# Patient Record
Sex: Male | Born: 1960 | Race: Black or African American | Hispanic: No
Health system: Midwestern US, Community
[De-identification: ages and names within clinical notes are randomized; demographics above are authoritative.]

## PROBLEM LIST (undated history)

## (undated) DIAGNOSIS — E785 Hyperlipidemia, unspecified: Secondary | ICD-10-CM

## (undated) DIAGNOSIS — R972 Elevated prostate specific antigen [PSA]: Secondary | ICD-10-CM

## (undated) DIAGNOSIS — N411 Chronic prostatitis: Secondary | ICD-10-CM

## (undated) DIAGNOSIS — N400 Enlarged prostate without lower urinary tract symptoms: Secondary | ICD-10-CM

## (undated) HISTORY — DX: Chronic prostatitis: N41.1

## (undated) HISTORY — DX: Elevated prostate specific antigen (PSA): R97.20

## (undated) HISTORY — DX: Benign prostatic hyperplasia without lower urinary tract symptoms: N40.0

## (undated) HISTORY — DX: Hyperlipidemia, unspecified: E78.5

## (undated) HISTORY — PX: KNEE SURGERY: SHX244

---

## 2019-04-03 ENCOUNTER — Emergency Department: Payer: 344 | Primary: Student in an Organized Health Care Education/Training Program

## 2019-04-03 ENCOUNTER — Emergency Department: Admit: 2019-04-03 | Payer: 344 | Primary: Student in an Organized Health Care Education/Training Program

## 2019-04-03 ENCOUNTER — Inpatient Hospital Stay: Admit: 2019-04-03 | Discharge: 2019-04-04 | Disposition: A | Discharge status: Home / Self Care | Payer: 344

## 2019-04-03 DIAGNOSIS — U071 COVID-19: Secondary | ICD-10-CM

## 2019-04-03 HISTORY — DX: COVID-19: U07.1

## 2019-04-03 LAB — CBC WITH AUTOMATED DIFF
ABS. BASOPHILS: 0 10*3/uL (ref 0.0–0.1)
ABS. EOSINOPHILS: 0.1 10*3/uL (ref 0.0–0.4)
ABS. IMM. GRANS.: 0 10*3/uL (ref 0.00–0.04)
ABS. LYMPHOCYTES: 1.2 10*3/uL (ref 0.8–3.5)
ABS. MONOCYTES: 0.5 10*3/uL (ref 0.0–1.0)
ABS. NEUTROPHILS: 3.6 10*3/uL (ref 1.8–8.0)
BASOPHILS: 0 % (ref 0–1)
EOSINOPHILS: 2 % (ref 0–7)
HCT: 43.6 % (ref 36.6–50.3)
HGB: 14.2 g/dL (ref 12.1–17.0)
IMMATURE GRANULOCYTES: 0 % (ref 0.0–0.5)
LYMPHOCYTES: 22 % (ref 12–49)
MCH: 26.2 PG (ref 26.0–34.0)
MCHC: 32.6 g/dL (ref 30.0–36.5)
MCV: 80.4 FL (ref 80.0–99.0)
MONOCYTES: 9 % (ref 5–13)
MPV: 9.9 FL (ref 8.9–12.9)
NEUTROPHILS: 67 % (ref 32–75)
PLATELET: 173 10*3/uL (ref 150–400)
RBC: 5.42 M/uL (ref 4.10–5.70)
RDW: 14.5 % (ref 11.5–14.5)
WBC: 5.5 10*3/uL (ref 4.1–11.1)

## 2019-04-03 LAB — CBC WITH AUTO DIFFERENTIAL
Basophils %: 0 % (ref 0–1)
Basophils Absolute: 0 10*3/uL (ref 0.0–0.1)
Eosinophils %: 2 % (ref 0–7)
Eosinophils Absolute: 0.1 10*3/uL (ref 0.0–0.4)
Granulocyte Absolute Count: 0 10*3/uL (ref 0.00–0.04)
Hematocrit: 43.6 % (ref 36.6–50.3)
Hemoglobin: 14.2 g/dL (ref 12.1–17.0)
Immature Granulocytes: 0 % (ref 0.0–0.5)
Lymphocytes %: 22 % (ref 12–49)
Lymphocytes Absolute: 1.2 10*3/uL (ref 0.8–3.5)
MCH: 26.2 PG (ref 26.0–34.0)
MCHC: 32.6 g/dL (ref 30.0–36.5)
MCV: 80.4 FL (ref 80.0–99.0)
MPV: 9.9 FL (ref 8.9–12.9)
Monocytes %: 9 % (ref 5–13)
Monocytes Absolute: 0.5 10*3/uL (ref 0.0–1.0)
Neutrophils %: 67 % (ref 32–75)
Neutrophils Absolute: 3.6 10*3/uL (ref 1.8–8.0)
Platelets: 173 10*3/uL (ref 150–400)
RBC: 5.42 M/uL (ref 4.10–5.70)
RDW: 14.5 % (ref 11.5–14.5)
WBC: 5.5 10*3/uL (ref 4.1–11.1)

## 2019-04-03 MED ORDER — SODIUM CHLORIDE 0.9% BOLUS IV
0.9 % | Freq: Once | INTRAVENOUS | Status: AC
Start: 2019-04-03 — End: 2019-04-03
  Administered 2019-04-03: 23:00:00 via INTRAVENOUS

## 2019-04-03 MED ORDER — ACETAMINOPHEN 500 MG TAB
500 mg | Freq: Once | ORAL | Status: AC
Start: 2019-04-03 — End: 2019-04-03
  Administered 2019-04-03: 23:00:00 via ORAL

## 2019-04-03 MED FILL — SODIUM CHLORIDE 0.9 % IV: INTRAVENOUS | Qty: 500

## 2019-04-03 MED FILL — TYLENOL EXTRA STRENGTH 500 MG TABLET: 500 mg | ORAL | Qty: 2

## 2019-04-03 NOTE — ED Notes (Signed)
Copies of labs sent with pt, rxs to [facility], o2 sats 98 %

## 2019-04-03 NOTE — ED Notes (Signed)
Reports generally weak all over, complaining of body aches, having SOB and frequent cough. SOB much worse with ambulation. Pt febrile during triage. Reports severe HA. Reports unable to taste or smell.

## 2019-04-03 NOTE — Progress Notes (Signed)
Phone number not working.

## 2019-04-03 NOTE — ED Provider Notes (Signed)
ED Provider Notes by Lynn Ito, MD at 04/03/19 1740                Author: Lynn Ito, MD  Service: --  Author Type: Physician       Filed: 04/05/19 2321  Date of Service: 04/03/19 1740  Status: Addendum          Editor: Lynn Ito, MD (Physician)          Related Notes: Original Note by Geraldine Solar, PA (Physician Assistant) filed at 04/03/19  2134               EMERGENCY DEPARTMENT HISTORY AND PHYSICAL EXAM           Date: 04/03/2019   Patient Name: Christopher Galvan        History of Presenting Illness          Chief Complaint       Patient presents with        ?  Cough     ?  Headache        ?  Shortness of Breath           History Provided By: Patient      HPI: Christopher Galvan,  59 y.o. male with a past medical history significant  for BPH and hypertension who presents to the ED with cc of gradual onset, gradually worsening generalized weakness, generalized body aches, and subjective fever x6 days.  Patient also complaining of  shortness of breath worsening with exertion, dry coughing, diffuse headache, anosmia, inability to taste, fatigue, diaphoresis as well.  No known Covid exposure.  No other sick contact.  Has been taking TheraFlu, with no relief.  Currently visiting family  from out of town. Has been in contact with multiple family members who are also from out of town. No history of lung or cardiac disease. Denies any nausea, vomiting, chest pain, dizziness, lightheadedness, one-sided weakness, visual changes.      There are no other complaints, changes, or physical findings at this time.      PCP: Unknown, Provider        No current facility-administered medications on file prior to encounter.           No current outpatient medications on file prior to encounter.             Past History        Past Medical History:     Past Medical History:        Diagnosis  Date         ?  BPH (benign prostatic hyperplasia)           ?  Hypertension             Past Surgical History:   History reviewed. No  pertinent surgical history.      Family History:   History reviewed. No pertinent family history.      Social History:     Social History          Tobacco Use         ?  Smoking status:  Never Smoker     ?  Smokeless tobacco:  Never Used       Substance Use Topics         ?  Alcohol use:  Never              Frequency:  Never         ?  Drug use:  Not on file           Allergies:   No Known Allergies           Review of Systems        Review of Systems    Constitutional: Positive for chills, diaphoresis , fatigue and fever.    HENT: Negative.     Respiratory: Positive for cough and shortness of breath . Negative for chest tightness and wheezing.     Cardiovascular: Negative for chest pain and palpitations.    Gastrointestinal: Negative for abdominal pain, diarrhea, nausea and vomiting.    Genitourinary: Negative for frequency and urgency.    Musculoskeletal: Positive for myalgias (generalized) . Negative for back pain, neck pain and neck stiffness.    Skin: Negative for rash.    Neurological: Positive for headaches. Negative for dizziness, weakness and light-headedness.    Psychiatric/Behavioral: Negative.     All other systems reviewed and are negative.           Physical Exam        Physical Exam   Vitals signs and nursing note reviewed.   Constitutional:        General: He is not in acute distress.     Appearance: Normal appearance. He is well-developed. He is not ill-appearing, toxic-appearing or diaphoretic.      Comments: Overweight AA  male, very warm to touch    HENT:       Head: Normocephalic and atraumatic.      Nose: Nose normal. No congestion or rhinorrhea.      Mouth/Throat:      Mouth: Mucous membranes are moist.      Pharynx: Oropharynx is clear. No oropharyngeal exudate or posterior oropharyngeal  erythema.   Eyes :       General: No scleral icterus.     Conjunctiva/sclera: Conjunctivae normal.      Pupils: Pupils are equal, round, and reactive to light.   Neck:       Musculoskeletal: Normal range of  motion and neck supple. No neck rigidity or muscular tenderness.    Cardiovascular:       Rate and Rhythm: Normal rate and regular rhythm.      Pulses:            Radial pulses are 2+ on the right side and 2+  on the left side.         Dorsalis pedis pulses are 2+ on the right side and 2+  on the left side.      Heart sounds: No murmur. No friction rub. No gallop.     Pulmonary:       Effort: Pulmonary effort is normal. No tachypnea, accessory muscle usage, respiratory distress or retractions.      Breath sounds: Normal breath sounds . No stridor. No decreased breath sounds, wheezing, rhonchi or rales.      Comments: No conversational dyspnea, SpO2 100% on room air  Chest:       Chest wall: No tenderness.   Abdominal :      General: Bowel sounds are normal. There is no distension.      Palpations: Abdomen is soft. There is no mass.      Tenderness: There is no abdominal tenderness. There is no right CVA tenderness, left CVA tenderness, guarding or rebound.     Musculoskeletal: Normal range of motion.          General: No deformity.  Right lower leg: No edema.      Left lower leg: No edema.    Skin:      General: Skin is warm and dry.      Capillary Refill: Capillary refill takes less than 2 seconds.      Coloration: Skin is not jaundiced or pale.      Findings: No bruising, erythema or rash.   Neurological:       General: No focal deficit present.      Mental Status: He is alert and oriented to person, place, and time. Mental status is at baseline.      Sensory: Sensation is intact.      Motor: Motor function is intact.   Psychiatric:         Mood and Affect: Mood normal.          Behavior: Behavior normal. Behavior is cooperative.         Thought Content: Thought content normal.         Judgment: Judgment normal.               Lab and Diagnostic Study Results        Labs -         Recent Results (from the past 12 hour(s))     CBC WITH AUTOMATED DIFF          Collection Time: 04/03/19  6:10 PM         Result   Value  Ref Range            WBC  5.5  4.1 - 11.1 K/uL       RBC  5.42  4.10 - 5.70 M/uL       HGB  14.2  12.1 - 17.0 g/dL       HCT  43.6  36.6 - 50.3 %       MCV  80.4  80.0 - 99.0 FL       MCH  26.2  26.0 - 34.0 PG       MCHC  32.6  30.0 - 36.5 g/dL       RDW  14.5  11.5 - 14.5 %       PLATELET  173  150 - 400 K/uL       MPV  9.9  8.9 - 12.9 FL       NEUTROPHILS  67  32 - 75 %       LYMPHOCYTES  22  12 - 49 %       MONOCYTES  9  5 - 13 %       EOSINOPHILS  2  0 - 7 %       BASOPHILS  0  0 - 1 %       IMMATURE GRANULOCYTES  0  0.0 - 0.5 %       ABS. NEUTROPHILS  3.6  1.8 - 8.0 K/UL       ABS. LYMPHOCYTES  1.2  0.8 - 3.5 K/UL       ABS. MONOCYTES  0.5  0.0 - 1.0 K/UL       ABS. EOSINOPHILS  0.1  0.0 - 0.4 K/UL       ABS. BASOPHILS  0.0  0.0 - 0.1 K/UL       ABS. IMM. GRANS.  0.0  0.00 - 0.04 K/UL       DF  AUTOMATED          METABOLIC PANEL, COMPREHENSIVE  Collection Time: 04/03/19  6:10 PM         Result  Value  Ref Range            Sodium  140  136 - 145 mmol/L       Potassium  3.6  3.5 - 5.1 mmol/L       Chloride  105  97 - 108 mmol/L       CO2  27  21 - 32 mmol/L       Anion gap  8  5 - 15 mmol/L       Glucose  117 (H)  65 - 100 mg/dL       BUN  10  6 - 20 mg/dL       Creatinine  1.11  0.70 - 1.30 mg/dL       BUN/Creatinine ratio  9 (L)  12 - 20         GFR est AA  >60  >60 ml/min/1.72m            GFR est non-AA  >60  >60 ml/min/1.745m           Calcium  8.6  8.5 - 10.1 mg/dL       Bilirubin, total  0.5  0.2 - 1.0 mg/dL       AST (SGOT)  23  15 - 37 U/L       ALT (SGPT)  61  12 - 78 U/L       Alk. phosphatase  45  45 - 117 U/L       Protein, total  7.8  6.4 - 8.2 g/dL       Albumin  3.6  3.5 - 5.0 g/dL       Globulin  4.2 (H)  2.0 - 4.0 g/dL       A-G Ratio  0.9 (L)  1.1 - 2.2         LACTIC ACID          Collection Time: 04/03/19  6:10 PM         Result  Value  Ref Range            Lactic acid  1.3  0.4 - 2.0 mmol/L       TROPONIN I          Collection Time: 04/03/19  6:10 PM         Result  Value   Ref Range            Troponin-I, Qt.  <0.05  <0.05 ng/mL       URINALYSIS W/ RFLX MICROSCOPIC          Collection Time: 04/03/19  7:20 PM         Result  Value  Ref Range            Color  Yellow/Straw          Appearance  Clear  Clear         Specific gravity  1.025  1.003 - 1.030         pH (UA)  5.0  5.0 - 8.0         Protein  Negative  Negative mg/dL       Glucose  Negative  Negative mg/dL       Ketone  Negative  Negative mg/dL       Bilirubin  Negative  Negative         Blood  Negative  Negative         Urobilinogen  2.0 (H)  0.1 - 1.0 EU/dL       Nitrites  Negative  Negative         Leukocyte Esterase  Negative  Negative         WBC  0-4  0 - 4 /hpf       RBC  0-5  0 - 5 /hpf       Bacteria  Negative  Negative /hpf            Mucus  Trace  /lpf           Radiologic Studies -         CXR Results   (Last 48 hours)                                    04/03/19 1826    XR CHEST PA LAT  Final result            Narrative:    2 views.      No comparisons.      Minimal airspace density in the right lower lung.      No edema, dense infiltrate or pneumothorax.      Normal cardiomediastinal silhouette.                                Medical Decision Making     - I am the first provider for this patient.      - I reviewed the vital signs, available nursing notes, past medical history, past surgical history, family history and social history.      - Initial assessment performed. The patients presenting problems have been discussed, and they are in agreement with the care plan formulated and outlined with them.  I have encouraged them to ask questions as they arise throughout their visit.      Vital Signs-Reviewed the patient's vital signs.   Patient Vitals for the past 12 hrs:            Temp  Pulse  Resp  BP  SpO2            04/03/19 2005  99.2 ??F (37.3 ??C)  --  (!) 84  (!) 141/100  98 %            04/03/19 1717  (!) 102.8 ??F (39.3 ??C)  (!) 110  24  (!) 159/96  94 %           Records Reviewed: Nursing Notes and Old Medical  Records      The patient presents with fever, cough, sob with a differential diagnosis of pneumonia, bronchitis, viral syndrome, COVID-19         ED Course:         ED Course as of Apr 02 2130       Fri Apr 03, 2019        2003  Patient ambulated with nursing. SpO2 remained 98% on room air but patient became very tachypneic with RR in 30s and felt nauseated.     [NO]     2131  Per ED nurse, it was mistakenly documented that patient's RR is 84. Confirmed that it is patient's HR that is 84.     [NO]  ED Course User Index   [NO] Ong-Pham, Audie Clear, PA           Provider Notes (Medical Decision Making):       MDM   Number of Diagnoses or Management Options   Pneumonia of right lower lobe due to infectious organism   Suspected COVID-19 virus infection   Diagnosis management comments:       59 year old with multiple complaints and shortness of breath.  Suspect COVID-19 infection given his complaints.  Will send PCR test.  Work-up including basic labs, troponin, EKG unremarkable.  Chest x-ray revealing right lower lobe pneumonia.  Gave patient  dose of ceftriaxone and azithromycin in the emergency department as well as steroids and inhaler.  He was able to ambulate without hypoxia.  Will discharge home with z-pak, steroids, inhaler.  Recommend self quarantine.  Return precautions discussed          Amount and/or Complexity of Data Reviewed   Clinical lab tests: ordered and reviewed   Tests in the radiology section of CPT??: ordered and reviewed   Review and summarize past medical records: yes      Patient Progress   Patient progress: stable                     Disposition     Disposition: DC- Adult Discharges: All of the diagnostic tests were reviewed and questions answered. Diagnosis, care  plan and treatment options were discussed.  The patient understands the instructions and will follow up as directed. The patients results have been reviewed with them.  They have been counseled regarding their diagnosis.  The  patient verbally convey understanding  and agreement of the signs, symptoms, diagnosis, treatment and prognosis and additionally agrees to follow up as recommended with their PCP in 24 - 48 hours.  They also agree with the care-plan and convey that all of their questions have been answered.   I have also put together some discharge instructions for them that include: 1) educational information regarding their diagnosis, 2) how to care for their diagnosis at home, as well a 3) list of reasons why they would want to return to the ED prior to  their follow-up appointment, should their condition change.      Discharged      DISCHARGE PLAN:   1. There are no discharge medications for this patient.      2.      Follow-up Information               Follow up With  Specialties  Details  Why  [place]              [facility]  Emergency Medicine    As needed, If symptoms worsen  [place]   [phone]              Primary Care Provider    In 1 week  As needed               3.  Return to ED if worse    4.      Current Discharge Medication List              START taking these medications          Details        azithromycin (Zithromax Z-Pak) 250 mg tablet  Take 2 tablets on day 1, then take 1 tablet daily  x 4 days   Qty: 6 Tab, Refills:  0               predniSONE (DELTASONE) 20 mg tablet  Take 40 mg by mouth daily for 5 days. With Breakfast   Qty: 10 Tab, Refills:  0               albuterol (ProAir HFA) 90 mcg/actuation inhaler  Take 1 Puff by inhalation every four (4) hours as needed for Wheezing.   Qty: 1 Inhaler, Refills:  0               benzonatate (TESSALON) 100 mg capsule  Take 1 Cap by mouth three (3) times daily as needed for Cough for up to 7 days.   Qty: 21 Cap, Refills:  0               ondansetron (Zofran ODT) 4 mg disintegrating tablet  Take 1 Tab by mouth every eight (8) hours as needed for Nausea or Vomiting for up to 3 days.   Qty: 12 Tab, Refills:  0                               Diagnosis        Clinical Impression:       1.  Pneumonia of right lower lobe due to infectious organism         2.  Suspected COVID-19 virus infection            Attestations:      Geraldine Solar, PA      Please note that this dictation was completed with Dragon, the computer voice recognition software.  Quite often unanticipated grammatical, syntax, homophones, and other interpretive errors are inadvertently  transcribed by the computer software.  Please disregard these errors.  Please excuse any errors that have escaped final proofreading.  Thank you.

## 2019-04-03 NOTE — ED Triage Notes (Signed)
Reports generally weak all over, complaining of body aches, having SOB and frequent cough. SOB much worse with ambulation. Pt febrile during triage. Reports severe HA. Reports unable to taste or smell.

## 2019-04-03 NOTE — ED Notes (Signed)
Copies of labs sent with pt, rxs to [facility], o2 sats 98 %

## 2019-04-03 NOTE — ED Provider Notes (Addendum)
EMERGENCY DEPARTMENT HISTORY AND PHYSICAL EXAM      Date: 04/03/2019  Patient Name: Christopher Galvan    History of Presenting Illness     Chief Complaint   Patient presents with   ??? Cough   ??? Headache   ??? Shortness of Breath       History Provided By: Patient    HPI: Christopher Galvan, 60 y.o. male with a past medical history significant for BPH and hypertension who presents to the ED with cc of gradual onset, gradually worsening generalized weakness, generalized body aches, and subjective fever x6 days.  Patient also complaining of shortness of breath worsening with exertion, dry coughing, diffuse headache, anosmia, inability to taste, fatigue, diaphoresis as well.  No known Covid exposure.  No other sick contact.  Has been taking TheraFlu, with no relief.  Currently visiting family from out of town. Has been in contact with multiple family members who are also from out of town. No history of lung or cardiac disease. Denies any nausea, vomiting, chest pain, dizziness, lightheadedness, one-sided weakness, visual changes.    There are no other complaints, changes, or physical findings at this time.    PCP: Unknown, Provider    No current facility-administered medications on file prior to encounter.      No current outpatient medications on file prior to encounter.       Past History     Past Medical History:  Past Medical History:   Diagnosis Date   ??? BPH (benign prostatic hyperplasia)    ??? Hypertension        Past Surgical History:  History reviewed. No pertinent surgical history.    Family History:  History reviewed. No pertinent family history.    Social History:  Social History     Tobacco Use   ??? Smoking status: Never Smoker   ??? Smokeless tobacco: Never Used   Substance Use Topics   ??? Alcohol use: Never     Frequency: Never   ??? Drug use: Not on file       Allergies:  No Known Allergies      Review of Systems     Review of Systems   Constitutional: Positive for chills, diaphoresis, fatigue and fever.   HENT: Negative.     Respiratory: Positive for cough and shortness of breath. Negative for chest tightness and wheezing.    Cardiovascular: Negative for chest pain and palpitations.   Gastrointestinal: Negative for abdominal pain, diarrhea, nausea and vomiting.   Genitourinary: Negative for frequency and urgency.   Musculoskeletal: Positive for myalgias (generalized). Negative for back pain, neck pain and neck stiffness.   Skin: Negative for rash.   Neurological: Positive for headaches. Negative for dizziness, weakness and light-headedness.   Psychiatric/Behavioral: Negative.    All other systems reviewed and are negative.      Physical Exam     Physical Exam  Vitals signs and nursing note reviewed.   Constitutional:       General: He is not in acute distress.     Appearance: Normal appearance. He is well-developed. He is not ill-appearing, toxic-appearing or diaphoretic.      Comments: Overweight AA male, very warm to touch   HENT:      Head: Normocephalic and atraumatic.      Nose: Nose normal. No congestion or rhinorrhea.      Mouth/Throat:      Mouth: Mucous membranes are moist.      Pharynx: Oropharynx is clear. No oropharyngeal  exudate or posterior oropharyngeal erythema.   Eyes:      General: No scleral icterus.     Conjunctiva/sclera: Conjunctivae normal.      Pupils: Pupils are equal, round, and reactive to light.   Neck:      Musculoskeletal: Normal range of motion and neck supple. No neck rigidity or muscular tenderness.   Cardiovascular:      Rate and Rhythm: Normal rate and regular rhythm.      Pulses:           Radial pulses are 2+ on the right side and 2+ on the left side.        Dorsalis pedis pulses are 2+ on the right side and 2+ on the left side.      Heart sounds: No murmur. No friction rub. No gallop.    Pulmonary:      Effort: Pulmonary effort is normal. No tachypnea, accessory muscle usage, respiratory distress or retractions.       Breath sounds: Normal breath sounds. No stridor. No decreased breath sounds, wheezing, rhonchi or rales.      Comments: No conversational dyspnea, SpO2 100% on room air  Chest:      Chest wall: No tenderness.   Abdominal:      General: Bowel sounds are normal. There is no distension.      Palpations: Abdomen is soft. There is no mass.      Tenderness: There is no abdominal tenderness. There is no right CVA tenderness, left CVA tenderness, guarding or rebound.   Musculoskeletal: Normal range of motion.         General: No deformity.      Right lower leg: No edema.      Left lower leg: No edema.   Skin:     General: Skin is warm and dry.      Capillary Refill: Capillary refill takes less than 2 seconds.      Coloration: Skin is not jaundiced or pale.      Findings: No bruising, erythema or rash.   Neurological:      General: No focal deficit present.      Mental Status: He is alert and oriented to person, place, and time. Mental status is at baseline.      Sensory: Sensation is intact.      Motor: Motor function is intact.   Psychiatric:         Mood and Affect: Mood normal.         Behavior: Behavior normal. Behavior is cooperative.         Thought Content: Thought content normal.         Judgment: Judgment normal.         Lab and Diagnostic Study Results     Labs -     Recent Results (from the past 12 hour(s))   CBC WITH AUTOMATED DIFF    Collection Time: 04/03/19  6:10 PM   Result Value Ref Range    WBC 5.5 4.1 - 11.1 K/uL    RBC 5.42 4.10 - 5.70 M/uL    HGB 14.2 12.1 - 17.0 g/dL    HCT 43.6 36.6 - 50.3 %    MCV 80.4 80.0 - 99.0 FL    MCH 26.2 26.0 - 34.0 PG    MCHC 32.6 30.0 - 36.5 g/dL    RDW 14.5 11.5 - 14.5 %    PLATELET 173 150 - 400 K/uL    MPV 9.9 8.9 - 12.9  FL    NEUTROPHILS 67 32 - 75 %    LYMPHOCYTES 22 12 - 49 %    MONOCYTES 9 5 - 13 %    EOSINOPHILS 2 0 - 7 %    BASOPHILS 0 0 - 1 %    IMMATURE GRANULOCYTES 0 0.0 - 0.5 %    ABS. NEUTROPHILS 3.6 1.8 - 8.0 K/UL    ABS. LYMPHOCYTES 1.2 0.8 - 3.5 K/UL     ABS. MONOCYTES 0.5 0.0 - 1.0 K/UL    ABS. EOSINOPHILS 0.1 0.0 - 0.4 K/UL    ABS. BASOPHILS 0.0 0.0 - 0.1 K/UL    ABS. IMM. GRANS. 0.0 0.00 - 0.04 K/UL    DF AUTOMATED     METABOLIC PANEL, COMPREHENSIVE    Collection Time: 04/03/19  6:10 PM   Result Value Ref Range    Sodium 140 136 - 145 mmol/L    Potassium 3.6 3.5 - 5.1 mmol/L    Chloride 105 97 - 108 mmol/L    CO2 27 21 - 32 mmol/L    Anion gap 8 5 - 15 mmol/L    Glucose 117 (H) 65 - 100 mg/dL    BUN 10 6 - 20 mg/dL    Creatinine 1.11 0.70 - 1.30 mg/dL    BUN/Creatinine ratio 9 (L) 12 - 20      GFR est AA >60 >60 ml/min/1.51m    GFR est non-AA >60 >60 ml/min/1.778m   Calcium 8.6 8.5 - 10.1 mg/dL    Bilirubin, total 0.5 0.2 - 1.0 mg/dL    AST (SGOT) 23 15 - 37 U/L    ALT (SGPT) 61 12 - 78 U/L    Alk. phosphatase 45 45 - 117 U/L    Protein, total 7.8 6.4 - 8.2 g/dL    Albumin 3.6 3.5 - 5.0 g/dL    Globulin 4.2 (H) 2.0 - 4.0 g/dL    A-G Ratio 0.9 (L) 1.1 - 2.2     LACTIC ACID    Collection Time: 04/03/19  6:10 PM   Result Value Ref Range    Lactic acid 1.3 0.4 - 2.0 mmol/L   TROPONIN I    Collection Time: 04/03/19  6:10 PM   Result Value Ref Range    Troponin-I, Qt. <0.05 <0.05 ng/mL   URINALYSIS W/ RFLX MICROSCOPIC    Collection Time: 04/03/19  7:20 PM   Result Value Ref Range    Color Yellow/Straw      Appearance Clear Clear      Specific gravity 1.025 1.003 - 1.030      pH (UA) 5.0 5.0 - 8.0      Protein Negative Negative mg/dL    Glucose Negative Negative mg/dL    Ketone Negative Negative mg/dL    Bilirubin Negative Negative      Blood Negative Negative      Urobilinogen 2.0 (H) 0.1 - 1.0 EU/dL    Nitrites Negative Negative      Leukocyte Esterase Negative Negative      WBC 0-4 0 - 4 /hpf    RBC 0-5 0 - 5 /hpf    Bacteria Negative Negative /hpf    Mucus Trace /lpf       Radiologic Studies -     CXR Results  (Last 48 hours)               04/03/19 1826  XR CHEST PA LAT Final result    Narrative:  2 views.   No  comparisons.    Minimal airspace density in the right lower lung.   No edema, dense infiltrate or pneumothorax.   Normal cardiomediastinal silhouette.               Medical Decision Making   - I am the first provider for this patient.    - I reviewed the vital signs, available nursing notes, past medical history, past surgical history, family history and social history.    - Initial assessment performed. The patients presenting problems have been discussed, and they are in agreement with the care plan formulated and outlined with them.  I have encouraged them to ask questions as they arise throughout their visit.    Vital Signs-Reviewed the patient's vital signs.  Patient Vitals for the past 12 hrs:   Temp Pulse Resp BP SpO2   04/03/19 2005 99.2 ??F (37.3 ??C) ??? (!) 84 (!) 141/100 98 %   04/03/19 1717 (!) 102.8 ??F (39.3 ??C) (!) 110 24 (!) 159/96 94 %       Records Reviewed: Nursing Notes and Old Medical Records    The patient presents with fever, cough, sob with a differential diagnosis of pneumonia, bronchitis, viral syndrome, COVID-19      ED Course:     ED Course as of Apr 02 2130   Fri Apr 03, 2019   2003 Patient ambulated with nursing. SpO2 remained 98% on room air but patient became very tachypneic with RR in 30s and felt nauseated.    [NO]   2131 Per ED nurse, it was mistakenly documented that patient's RR is 84. Confirmed that it is patient's HR that is 84.    [NO]      ED Course User Index  [NO] Ong-Pham, Audie Clear, PA       Provider Notes (Medical Decision Making):     MDM  Number of Diagnoses or Management Options  Pneumonia of right lower lobe due to infectious organism  Suspected COVID-19 virus infection  Diagnosis management comments:      59 year old with multiple complaints and shortness of breath.  Suspect COVID-19 infection given his complaints.  Will send PCR test.  Work-up including basic labs, troponin, EKG unremarkable.  Chest x-ray revealing right lower lobe pneumonia.  Gave patient dose of ceftriaxone and azithromycin in the emergency department as well as steroids and inhaler.  He was able to ambulate without hypoxia.  Will discharge home with z-pak, steroids, inhaler.  Recommend self quarantine.  Return precautions discussed       Amount and/or Complexity of Data Reviewed  Clinical lab tests: ordered and reviewed  Tests in the radiology section of CPT??: ordered and reviewed  Review and summarize past medical records: yes    Patient Progress  Patient progress: stable             Disposition   Disposition: DC- Adult Discharges: All of the diagnostic tests were reviewed and questions answered. Diagnosis, care plan and treatment options were discussed.  The patient understands the instructions and will follow up as directed. The patients results have been reviewed with them.  They have been counseled regarding their diagnosis.  The patient verbally convey understanding and agreement of the signs, symptoms, diagnosis, treatment and prognosis and additionally agrees to follow up as recommended with their PCP in 24 - 48 hours.  They also agree with the care-plan and convey that all of their questions have been answered.  I have also put together some discharge instructions for them  that include: 1) educational information regarding their diagnosis, 2) how to care for their diagnosis at home, as well a 3) list of reasons why they would want to return to the ED prior to their follow-up appointment, should their condition change.    Discharged    DISCHARGE PLAN:  1. There are no discharge medications for this patient.    2.   Follow-up Information     Follow up With Specialties Details [facility]     [place] Emergency Medicine  As needed, If symptoms worsen [place]  [phone]    Primary Care Provider  In 1 week As needed         3.  Return to ED if worse   4.   Current Discharge Medication List      START taking these medications    Details   azithromycin (Zithromax Z-Pak) 250 mg tablet Take 2 tablets on day 1, then take 1 tablet daily x 4 days  Qty: 6 Tab, Refills: 0      predniSONE (DELTASONE) 20 mg tablet Take 40 mg by mouth daily for 5 days. With Breakfast  Qty: 10 Tab, Refills: 0      albuterol (ProAir HFA) 90 mcg/actuation inhaler Take 1 Puff by inhalation every four (4) hours as needed for Wheezing.  Qty: 1 Inhaler, Refills: 0      benzonatate (TESSALON) 100 mg capsule Take 1 Cap by mouth three (3) times daily as needed for Cough for up to 7 days.  Qty: 21 Cap, Refills: 0      ondansetron (Zofran ODT) 4 mg disintegrating tablet Take 1 Tab by mouth every eight (8) hours as needed for Nausea or Vomiting for up to 3 days.  Qty: 12 Tab, Refills: 0               Diagnosis     Clinical Impression:   1. Pneumonia of right lower lobe due to infectious organism    2. Suspected COVID-19 virus infection        Attestations:    Geraldine Solar, PA    Please note that this dictation was completed with Dragon, the computer voice recognition software.  Quite often unanticipated grammatical, syntax, homophones, and other interpretive errors are inadvertently transcribed by the computer software.  Please disregard these errors.  Please excuse any errors that have escaped final proofreading.  Thank you.

## 2019-04-03 NOTE — Other (Signed)
covid test done, pt dx pneumonia, o2 sats remain above 96 percent

## 2019-04-03 NOTE — Other (Cosign Needed)
Spoke with patient.  He called looking for his COVID-19 test results.  Patient tested positive on 04/03/2019.  According to notes, phone number was not working at that time.  Was able to discuss results with patient at this time.  He states he is feeling ill still, coughing a lot, having a hard time breathing.  I advised him that if he felt like he was having a hard time breathing and his symptoms were not improving, he should return to the ER to be evaluated.  He is taking his medications as prescribed at this time.  He states he will see how he feels in a little bit and potentially come back to be seen.

## 2019-04-04 LAB — URINALYSIS W/ RFLX MICROSCOPIC
BACTERIA, URINE: NEGATIVE /hpf
Bacteria: NEGATIVE /hpf
Bilirubin, Urine: NEGATIVE
Bilirubin: NEGATIVE
Blood, Urine: NEGATIVE
Blood: NEGATIVE
Glucose, Ur: NEGATIVE mg/dL
Glucose: NEGATIVE mg/dL
Ketone: NEGATIVE mg/dL
Ketones, Urine: NEGATIVE mg/dL
Leukocyte Esterase, Urine: NEGATIVE
Leukocyte Esterase: NEGATIVE
Nitrite, Urine: NEGATIVE
Nitrites: NEGATIVE
Protein, UA: NEGATIVE mg/dL
Protein: NEGATIVE mg/dL
Specific Gravity, UA: 1.025 (ref 1.003–1.030)
Specific gravity: 1.025 (ref 1.003–1.030)
Urobilinogen, UA, POCT: 2 EU/dL — ABNORMAL HIGH (ref 0.1–1.0)
Urobilinogen: 2 EU/dL — ABNORMAL HIGH (ref 0.1–1.0)
pH (UA): 5 (ref 5.0–8.0)
pH, UA: 5 (ref 5.0–8.0)

## 2019-04-04 LAB — METABOLIC PANEL, COMPREHENSIVE
A-G Ratio: 0.9 — ABNORMAL LOW (ref 1.1–2.2)
ALT (SGPT): 61 U/L (ref 12–78)
AST (SGOT): 23 U/L (ref 15–37)
Albumin: 3.6 g/dL (ref 3.5–5.0)
Alk. phosphatase: 45 U/L (ref 45–117)
Anion gap: 8 mmol/L (ref 5–15)
BUN/Creatinine ratio: 9 — ABNORMAL LOW (ref 12–20)
BUN: 10 mg/dL (ref 6–20)
Bilirubin, total: 0.5 mg/dL (ref 0.2–1.0)
CO2: 27 mmol/L (ref 21–32)
Calcium: 8.6 mg/dL (ref 8.5–10.1)
Chloride: 105 mmol/L (ref 97–108)
Creatinine: 1.11 mg/dL (ref 0.70–1.30)
GFR est AA: >60 mL/min/{1.73_m2} (ref >60)
GFR est non-AA: >60 mL/min/{1.73_m2} (ref >60)
Globulin: 4.2 g/dL — ABNORMAL HIGH (ref 2.0–4.0)
Glucose: 117 mg/dL — ABNORMAL HIGH (ref 65–100)
Potassium: 3.6 mmol/L (ref 3.5–5.1)
Protein, total: 7.8 g/dL (ref 6.4–8.2)
Sodium: 140 mmol/L (ref 136–145)

## 2019-04-04 LAB — LACTIC ACID
Lactic Acid: 1.3 mmol/L (ref 0.4–2.0)
Lactic acid: 1.3 mmol/L (ref 0.4–2.0)

## 2019-04-04 LAB — TROPONIN I: Troponin-I, Qt.: <0.05 ng/mL (ref <0.05)

## 2019-04-04 LAB — SARS-COV-2

## 2019-04-04 LAB — COMPREHENSIVE METABOLIC PANEL
ALT: 61 U/L (ref 12–78)
AST: 23 U/L (ref 15–37)
Albumin/Globulin Ratio: 0.9 — ABNORMAL LOW (ref 1.1–2.2)
Albumin: 3.6 g/dL (ref 3.5–5.0)
Alkaline Phosphatase: 45 U/L (ref 45–117)
Anion Gap: 8 mmol/L (ref 5–15)
BUN: 10 mg/dL (ref 6–20)
Bun/Cre Ratio: 9 — ABNORMAL LOW (ref 12–20)
CO2: 27 mmol/L (ref 21–32)
Calcium: 8.6 mg/dL (ref 8.5–10.1)
Chloride: 105 mmol/L (ref 97–108)
Creatinine: 1.11 mg/dL (ref 0.70–1.30)
EGFR IF NonAfrican American: >60 mL/min/{1.73_m2} (ref >60)
GFR African American: >60 mL/min/{1.73_m2} (ref >60)
Globulin: 4.2 g/dL — ABNORMAL HIGH (ref 2.0–4.0)
Glucose: 117 mg/dL — ABNORMAL HIGH (ref 65–100)
Potassium: 3.6 mmol/L (ref 3.5–5.1)
Sodium: 140 mmol/L (ref 136–145)
Total Bilirubin: 0.5 mg/dL (ref 0.2–1.0)
Total Protein: 7.8 g/dL (ref 6.4–8.2)

## 2019-04-04 LAB — COVID-19

## 2019-04-04 LAB — TROPONIN: Troponin I: <0.05 ng/mL (ref <0.05)

## 2019-04-04 MED ORDER — SODIUM CHLORIDE 0.9% BOLUS IV
0.9 % | Freq: Once | INTRAVENOUS | Status: AC
Start: 2019-04-04 — End: 2019-04-03
  Administered 2019-04-04: 02:00:00 via INTRAVENOUS

## 2019-04-04 MED ORDER — VIAL-MATE ADAPTOR
500 mg | Status: AC
Start: 2019-04-04 — End: 2019-04-03
  Administered 2019-04-04: 01:00:00 via INTRAVENOUS

## 2019-04-04 MED ORDER — AZITHROMYCIN 250 MG TAB
250 mg | ORAL_TABLET | ORAL | 0 refills | Status: AC
Start: 2019-04-04 — End: ?

## 2019-04-04 MED ORDER — ALBUTEROL SULFATE HFA 90 MCG/ACTUATION AEROSOL INHALER
90 mcg/actuation | Freq: Three times a day (TID) | RESPIRATORY_TRACT | Status: DC
Start: 2019-04-04 — End: 2019-04-04

## 2019-04-04 MED ORDER — ONDANSETRON 4 MG TAB, RAPID DISSOLVE
4 mg | ORAL_TABLET | Freq: Three times a day (TID) | ORAL | 0 refills | Status: AC | PRN
Start: 2019-04-04 — End: 2019-04-06

## 2019-04-04 MED ORDER — METHYLPREDNISOLONE (PF) 125 MG/2 ML IJ SOLR
125 mg/2 mL | INTRAMUSCULAR | Status: AC
Start: 2019-04-04 — End: 2019-04-03
  Administered 2019-04-04: 02:00:00 via INTRAVENOUS

## 2019-04-04 MED ORDER — PREDNISONE 20 MG TAB
20 mg | ORAL_TABLET | Freq: Every day | ORAL | 0 refills | Status: AC
Start: 2019-04-04 — End: 2019-04-08

## 2019-04-04 MED ORDER — ALBUTEROL SULFATE HFA 90 MCG/ACTUATION AEROSOL INHALER
90 mcg/actuation | RESPIRATORY_TRACT | 0 refills | Status: AC | PRN
Start: 2019-04-04 — End: ?

## 2019-04-04 MED ORDER — BENZONATATE 100 MG CAP
100 mg | ORAL_CAPSULE | Freq: Three times a day (TID) | ORAL | 0 refills | Status: AC | PRN
Start: 2019-04-04 — End: 2019-04-10

## 2019-04-04 MED ORDER — WATER FOR INJECTION, STERILE INJECTION
1 gram | INTRAMUSCULAR | Status: AC
Start: 2019-04-04 — End: 2019-04-03
  Administered 2019-04-04: 01:00:00 via INTRAVENOUS

## 2019-04-04 MED ORDER — ONDANSETRON (PF) 4 MG/2 ML INJECTION
4 mg/2 mL | INTRAMUSCULAR | Status: AC
Start: 2019-04-04 — End: 2019-04-03
  Administered 2019-04-04: 02:00:00 via INTRAVENOUS

## 2019-04-04 MED FILL — CEFTRIAXONE 1 GRAM SOLUTION FOR INJECTION: 1 gram | INTRAMUSCULAR | Qty: 1

## 2019-04-04 MED FILL — AZITHROMYCIN 500 MG IV SOLUTION: 500 mg | INTRAVENOUS | Qty: 5

## 2019-04-04 MED FILL — ONDANSETRON (PF) 4 MG/2 ML INJECTION: 4 mg/2 mL | INTRAMUSCULAR | Qty: 2

## 2019-04-04 MED FILL — SOLU-MEDROL (PF) 125 MG/2 ML SOLUTION FOR INJECTION: 125 mg/2 mL | INTRAMUSCULAR | Qty: 2

## 2019-04-04 MED FILL — SODIUM CHLORIDE 0.9 % IV: INTRAVENOUS | Qty: 1000

## 2019-04-06 LAB — NOVEL CORONAVIRUS (COVID-19): SARS-CoV-2: DETECTED — AB

## 2019-04-06 LAB — COVID-19: SARS-CoV-2: DETECTED — AB

## 2019-04-10 LAB — CULTURE, BLOOD, PAIRED
Culture result:: NO GROWTH
Culture: NO GROWTH

## 2019-11-03 ENCOUNTER — Telehealth (INDEPENDENT_AMBULATORY_CARE_PROVIDER_SITE_OTHER): Payer: Self-pay | Admitting: Internal Medicine

## 2019-11-03 ENCOUNTER — Encounter: Payer: Self-pay | Admitting: Internal Medicine

## 2019-11-03 DIAGNOSIS — R972 Elevated prostate specific antigen [PSA]: Secondary | ICD-10-CM

## 2019-11-03 DIAGNOSIS — N401 Enlarged prostate with lower urinary tract symptoms: Secondary | ICD-10-CM

## 2019-11-03 DIAGNOSIS — E785 Hyperlipidemia, unspecified: Secondary | ICD-10-CM

## 2019-11-03 DIAGNOSIS — M1990 Unspecified osteoarthritis, unspecified site: Secondary | ICD-10-CM

## 2019-11-03 DIAGNOSIS — U071 COVID-19: Secondary | ICD-10-CM

## 2019-11-03 DIAGNOSIS — N411 Chronic prostatitis: Secondary | ICD-10-CM

## 2019-11-03 MED ORDER — FINASTERIDE 5 MG PO TABS: 5.0000 mg | ORAL_TABLET | Freq: Every day | ORAL | 11 refills | Status: DC

## 2019-11-03 NOTE — Progress Notes (Signed)
Subjective:    Patient ID: Corey Norton, male   DOB: 10/19/1960, 59 y.o.   MRN: [id number]   HPI   Virtual Visit via [facility].  1.  Prostate Issues:  Was seen at Urology in [place], [place] at [facility] about 3 months ago when still living there.  He self referred.   He states he has had years of problems with a "swollen prostate gland".   Was told prostate was enlarged dating back to even 2009.  Developed symptoms of BPH in 2011.  Urinary urge and frequency.  No urinary hesitancy.  Nocturia 3-5 times.  No decrease in urinary stream.  Lot of discomfort in perineal area with occasional sharp pain in anal area.  Sounds like these symptoms all come and go.   Never treated previously, though states he has been told he had prostatitis.  In 06/2018, was told by a clinic in [place] that his PSA had gone up and should see a Insurance underwriter, which was part of the reason he made the appt with Urology. With Urology 3 months ago, was placed on Tamsulosin, Cipro, Doxy, the latter two for 14 day course, none of which he has started.  Sounds like he underwent ultrasound of bladder/possibly prostate.   Urologist wanted to treat him first before undergoing biopsy.  Bloodwork performed there as well. Was frustrated with Urologist not explaining what he felt was going on and so just filled meds, but did not take any of them.   Currently not sexually active and has not been so for past 3 years.  He is heterosexual.  Father with history of prostate cancer.  Died at age 15 yo of metastatic prostate cancer.  He does not know how old his father was at time of diagnosis.  Patient with a brother age 41 and younger brother, age 19 yo.  Older brother without prostate disease.  Younger brother with what sounds like BPH issues.  Not clear if older brother has had elevated PSA.  Younger brother with normal PSA.   2.  Joint pain:  Problems starting about 1 year ago in June 2020.  Elbows, fingers, knees, calf  areas hurt down into ankles.  Has morning stiffness for 2-3 hours, particularly of hands and fingers.  Has had swelling of fingers for a few days back in July of 2020.  No erythema of joints he can tell. Was ill with COVID January 1st and joints worsened.  Did have pneumonia, but was not hospitalized.   Grandmother and some of cousins with history of arthritis.  No definite history of RA.  His sister's daughter has SLE.  3. COVID vaccination:  Has not obtained.  Discussed at length importance of obtaining.   Current Meds  Medication Sig  . acetaminophen (TYLENOL) 500 MG tablet Take 1,000 mg by mouth every 6 (six) hours as needed. At bedtime  . ciprofloxacin (CIPRO) 500 MG tablet Take 500 mg by mouth 2 (two) times daily. 14 days  . doxycycline (VIBRAMYCIN) 100 MG capsule Take 100 mg by mouth 2 (two) times daily. 14 days  . ibuprofen (ADVIL) 200 MG tablet Take 200 mg by mouth every 6 (six) hours as needed. 2 tabs by mouth at bedtime  . tamsulosin (FLOMAX) 0.4 MG CAPS capsule Take 0.4 mg by mouth.   No Known Allergies   Review of Systems    Objective:   There were no vitals taken for this visit.  Physical Exam  NAD Appears well.  Perhaps with some hypertrophy of PIPs of fingers, but do not see significant swelling of MCPs.   Assessment & Plan  1.  BPH/Chronic prostatitis/elevated PSA:  Need records from Urology and primary care clinic in [place].  Encouraged him to take the medications he filled, including Doxy, Cipro for 14 day course and the Tamsulosin indefinitely.  Have sent in an Rx for Finasteride as well 5 mg daily with hopes of maintaining the latter after 6 months and discontinuing the Tamsulosin then.   Good rx for Finasteride to [facility]. Explained how BPH or prostatitis can increase PSA.   To come into clinic on Monday for fasting labs, pick up [facility] card application, sign releases and hopefully start COVID vaccination.  2.  Arthritis/arthralgias:  ANA, RF,  Sed Rate, CBC, CMP on Monday  3.  History of hyperlipidemia:  FLP on Monday.  4.  HM:  COVID vaccination discussed extensively today  Spent 70 minutes with patient on video today

## 2019-11-09 ENCOUNTER — Other Ambulatory Visit: Payer: Self-pay

## 2019-11-28 ENCOUNTER — Emergency Department (HOSPITAL_COMMUNITY): Payer: Self-pay

## 2019-11-28 ENCOUNTER — Emergency Department (HOSPITAL_COMMUNITY)
Admission: EM | Admit: 2019-11-28 | Discharge: 2019-11-29 | Disposition: A | Discharge status: Home / Self Care | Payer: Self-pay | Attending: Emergency Medicine | Admitting: Emergency Medicine

## 2019-11-28 ENCOUNTER — Other Ambulatory Visit: Payer: Self-pay

## 2019-11-28 ENCOUNTER — Encounter (HOSPITAL_COMMUNITY): Payer: Self-pay | Admitting: *Deleted

## 2019-11-28 DIAGNOSIS — R079 Chest pain, unspecified: Secondary | ICD-10-CM | POA: Insufficient documentation

## 2019-11-28 DIAGNOSIS — Z20822 Contact with and (suspected) exposure to covid-19: Secondary | ICD-10-CM | POA: Insufficient documentation

## 2019-11-28 DIAGNOSIS — R05 Cough: Secondary | ICD-10-CM | POA: Insufficient documentation

## 2019-11-28 DIAGNOSIS — Z5321 Procedure and treatment not carried out due to patient leaving prior to being seen by health care provider: Secondary | ICD-10-CM | POA: Insufficient documentation

## 2019-11-28 DIAGNOSIS — R0602 Shortness of breath: Secondary | ICD-10-CM | POA: Insufficient documentation

## 2019-11-28 LAB — BASIC METABOLIC PANEL
Anion gap: 9 (ref 5–15)
BUN: 10 mg/dL (ref 6–20)
CO2: 27 mmol/L (ref 22–32)
Calcium: 9.4 mg/dL (ref 8.9–10.3)
Chloride: 103 mmol/L (ref 98–111)
Creatinine, Ser: 1.01 mg/dL (ref 0.61–1.24)
GFR calc Af Amer: >60 mL/min (ref >60)
GFR calc non Af Amer: >60 mL/min (ref >60)
Glucose, Bld: 82 mg/dL (ref 70–99)
Potassium: 3.7 mmol/L (ref 3.5–5.1)
Sodium: 139 mmol/L (ref 135–145)

## 2019-11-28 LAB — TROPONIN I (HIGH SENSITIVITY): Troponin I (High Sensitivity): 5 ng/L (ref <18)

## 2019-11-28 LAB — CBC
HCT: 47.6 % (ref 39.0–52.0)
Hemoglobin: 15.1 g/dL (ref 13.0–17.0)
MCH: 25.7 pg — ABNORMAL LOW (ref 26.0–34.0)
MCHC: 31.7 g/dL (ref 30.0–36.0)
MCV: 81.1 fL (ref 80.0–100.0)
Platelets: 348 10*3/uL (ref 150–400)
RBC: 5.87 MIL/uL — ABNORMAL HIGH (ref 4.22–5.81)
RDW: 13.7 % (ref 11.5–15.5)
WBC: 8.2 10*3/uL (ref 4.0–10.5)
nRBC: 0 % (ref 0.0–0.2)

## 2019-11-28 LAB — SARS CORONAVIRUS 2 BY RT PCR (HOSPITAL ORDER, PERFORMED IN ~~LOC~~ HOSPITAL LAB): SARS Coronavirus 2: NEGATIVE

## 2019-11-28 NOTE — ED Triage Notes (Signed)
Pt states onset of CP on Thursday, intermittent, worse today. Tightness, Pressure  across the chest. Pt has also had a cough and SOB since yesterday.

## 2019-11-29 LAB — TROPONIN I (HIGH SENSITIVITY): Troponin I (High Sensitivity): 4 ng/L (ref <18)

## 2019-11-29 NOTE — ED Notes (Signed)
Pt stated he was leaving. 

## 2020-01-04 ENCOUNTER — Ambulatory Visit: Payer: Self-pay | Admitting: Internal Medicine

## 2020-04-09 ENCOUNTER — Emergency Department (HOSPITAL_BASED_OUTPATIENT_CLINIC_OR_DEPARTMENT_OTHER): Payer: Self-pay

## 2020-04-09 ENCOUNTER — Encounter (HOSPITAL_BASED_OUTPATIENT_CLINIC_OR_DEPARTMENT_OTHER): Payer: Self-pay | Admitting: *Deleted

## 2020-04-09 ENCOUNTER — Other Ambulatory Visit: Payer: Self-pay

## 2020-04-09 ENCOUNTER — Emergency Department (HOSPITAL_BASED_OUTPATIENT_CLINIC_OR_DEPARTMENT_OTHER)
Admission: EM | Admit: 2020-04-09 | Discharge: 2020-04-09 | Disposition: A | Discharge status: Home / Self Care | Payer: Self-pay | Attending: Emergency Medicine | Admitting: Emergency Medicine

## 2020-04-09 DIAGNOSIS — Z8616 Personal history of COVID-19: Secondary | ICD-10-CM | POA: Insufficient documentation

## 2020-04-09 DIAGNOSIS — J069 Acute upper respiratory infection, unspecified: Secondary | ICD-10-CM | POA: Insufficient documentation

## 2020-04-09 DIAGNOSIS — R059 Cough, unspecified: Secondary | ICD-10-CM

## 2020-04-09 DIAGNOSIS — Z20822 Contact with and (suspected) exposure to covid-19: Secondary | ICD-10-CM | POA: Insufficient documentation

## 2020-04-09 MED ORDER — DEXAMETHASONE SODIUM PHOSPHATE 10 MG/ML IJ SOLN
10.0000 mg | Freq: Once | INTRAMUSCULAR | Status: AC
  Administered 2020-04-09: 10 mg via INTRAMUSCULAR
  Filled 2020-04-09: qty 1

## 2020-04-09 MED ORDER — DOXYCYCLINE HYCLATE 100 MG PO CAPS: 100.0000 mg | ORAL_CAPSULE | Freq: Two times a day (BID) | ORAL | 0 refills | Status: AC

## 2020-04-09 NOTE — ED Triage Notes (Signed)
Fever, cough, runny nose, body aches, headache, red eyes x 1 week. Pt has not had covid vaccine or flu shot

## 2020-04-09 NOTE — ED Provider Notes (Signed)
[facility] EMERGENCY DEPARTMENT Provider Note   CSN: [id number] Arrival date & time: 04/09/20  1923     History Chief Complaint  Patient presents with  . Cough    Corey Norton is a 60 y.o. male.  The history is provided by the patient.  Cough Cough characteristics:  Non-productive Sputum characteristics:  Nondescript Severity:  Mild Onset quality:  Gradual Timing:  Intermittent Progression:  Waxing and waning Chronicity:  New Smoker: no   Context: upper respiratory infection   Relieved by:  Nothing Worsened by:  Nothing Associated symptoms: fever and sinus congestion   Associated symptoms: no chest pain, no chills, no ear pain, no rash, no shortness of breath and no sore throat        Past Medical History:  Diagnosis Date  . BPH (benign prostatic hyperplasia)   . Chronic prostatitis   . COVID-19 04/2019   outpatient pneumonitis  . Elevated PSA   . Hyperlipidemia     Patient Active Problem List   Diagnosis Date Noted  . Arthritis 11/03/2019  . Elevated PSA 11/03/2019  . Benign prostatic hyperplasia with lower urinary tract symptoms 11/03/2019  . Prostatitis, chronic 11/03/2019  . Hyperlipidemia 11/03/2019  . COVID-19 04/2019    Past Surgical History:  Procedure Laterality Date  . KNEE SURGERY         Family History  Problem Relation Age of Onset  . Cancer Father        Prostate--cause of death  . Prostatitis Brother        vs BPH    Social History   Tobacco Use  . Smoking status: Never Smoker  . Smokeless tobacco: Never Used  Substance Use Topics  . Alcohol use: Yes  . Drug use: Never    Home Medications Prior to Admission medications   Medication Sig Start Date End Date Taking? Authorizing Provider  acetaminophen (TYLENOL) 500 MG tablet Take 1,000 mg by mouth every 6 (six) hours as needed. At bedtime    [provider]  ciprofloxacin (CIPRO) 500 MG tablet Take 500 mg by mouth 2 (two) times daily. 14 days     [provider]  doxycycline (VIBRAMYCIN) 100 MG capsule Take 100 mg by mouth 2 (two) times daily. 14 days    [provider]  finasteride (PROSCAR) 5 MG tablet Take 1 tablet (5 mg total) by mouth daily. 11/03/19   Julieanne Manson, MD  ibuprofen (ADVIL) 200 MG tablet Take 200 mg by mouth every 6 (six) hours as needed. 2 tabs by mouth at bedtime    [provider]  tamsulosin (FLOMAX) 0.4 MG CAPS capsule Take 0.4 mg by mouth.    [provider]    Allergies    Patient has no known allergies.  Review of Systems   Review of Systems  Constitutional: Positive for fever. Negative for chills.  HENT: Negative for ear pain and sore throat.   Eyes: Negative for pain and visual disturbance.  Respiratory: Positive for cough. Negative for shortness of breath.   Cardiovascular: Negative for chest pain and palpitations.  Gastrointestinal: Negative for abdominal pain and vomiting.  Genitourinary: Negative for dysuria and hematuria.  Musculoskeletal: Negative for arthralgias and back pain.  Skin: Negative for color change and rash.  Neurological: Negative for seizures and syncope.  All other systems reviewed and are negative.   Physical Exam Updated Vital Signs  ED Triage Vitals  Enc Vitals Group     BP 04/09/20 1929 (!) 143/105  Pulse Rate 04/09/20 1929 (!) 110     Resp 04/09/20 1929 20     Temp 04/09/20 1929 99.2 F (37.3 C)     Temp Source 04/09/20 1929 Oral     SpO2 04/09/20 1929 100 %     Weight 04/09/20 1933 197 lb (89.4 kg)     Height 04/09/20 1933 5\' 9"  (1.753 m)     Head Circumference --      Peak Flow --      Pain Score 04/09/20 1933 6     Pain Loc --      Pain Edu? --      Excl. in GC? --     Physical Exam Vitals and nursing note reviewed.  Constitutional:      General: He is not in acute distress.    Appearance: He is well-developed and well-nourished. He is not ill-appearing.  HENT:     Head: Normocephalic and atraumatic.      Nose: Nose normal.     Mouth/Throat:     Mouth: Mucous membranes are moist.  Eyes:     Extraocular Movements: Extraocular movements intact.     Conjunctiva/sclera: Conjunctivae normal.     Pupils: Pupils are equal, round, and reactive to light.  Cardiovascular:     Rate and Rhythm: Normal rate and regular rhythm.     Pulses: Normal pulses.     Heart sounds: Normal heart sounds. No murmur heard.   Pulmonary:     Effort: Pulmonary effort is normal. No respiratory distress.     Breath sounds: Normal breath sounds.  Abdominal:     Palpations: Abdomen is soft.     Tenderness: There is no abdominal tenderness.  Musculoskeletal:        General: No edema. Normal range of motion.     Cervical back: Normal range of motion and neck supple.  Skin:    General: Skin is warm and dry.     Capillary Refill: Capillary refill takes less than 2 seconds.  Neurological:     General: No focal deficit present.     Mental Status: He is alert.  Psychiatric:        Mood and Affect: Mood and affect normal.     ED Results / Procedures / Treatments   Labs (all labs ordered are listed, but only abnormal results are displayed) Labs Reviewed  SARS CORONAVIRUS 2 (TAT 6-24 HRS)    EKG None  Radiology DG Chest Port 1 View  Result Date: 04/09/2020 CLINICAL DATA:  Cough. EXAM: PORTABLE CHEST 1 VIEW COMPARISON:  11/28/2019 FINDINGS: Normal heart size and mediastinal contours. Vague opacities at both lung bases. No pulmonary edema, pleural effusion or pneumothorax. No acute osseous abnormalities are seen. IMPRESSION: Vague opacities at both lung bases, atelectasis versus pneumonia in the setting of cough. Electronically Signed   By: 11/30/2019 M.D.   On: 04/09/2020 20:33    Procedures Procedures (including critical care time)  Medications Ordered in ED Medications  dexamethasone (DECADRON) injection 10 mg (has no administration in time range)    ED Course  I have reviewed the triage vital  signs and the nursing notes.  Pertinent labs & imaging results that were available during my care of the patient were reviewed by me and considered in my medical decision making (see chart for details).    MDM Rules/Calculators/A&P  Corey Norton is a 60 year old male with history of COVID-19 about a year ago presents the ED with runny nose, and URI symptoms.  Multiple sick contacts as well.  Has not had Covid vaccine.  Chest x-ray showing atelectasis versus possible pneumonia.  Patient overall appears well.  No hypoxia both with ambulation and at rest.  Overall well-appearing.  Will give a dose of Decadron.  Will prescribe doxycycline.  Will test for Covid.  Understands return precautions.  Discharged in ED in good condition.  This chart was dictated using voice recognition software.  Despite best efforts to proofread,  errors can occur which can change the documentation meaning.  Corey Norton was evaluated in Emergency Department on 04/09/2020 for the symptoms described in the history of present illness. He was evaluated in the context of the global COVID-19 pandemic, which necessitated consideration that the patient might be at risk for infection with the SARS-CoV-2 virus that causes COVID-19. Institutional protocols and algorithms that pertain to the evaluation of patients at risk for COVID-19 are in a state of rapid change based on information released by regulatory bodies including the CDC and federal and state organizations. These policies and algorithms were followed during the patient's care in the ED.    Final Clinical Impression(s) / ED Diagnoses Final diagnoses:  Cough  Viral upper respiratory tract infection    Rx / DC Orders ED Discharge Orders    None       Virgina Norfolk, DO 04/09/20 2153

## 2020-04-09 NOTE — Discharge Instructions (Signed)
I have decided to start you on antibiotic please take as prescribed.  Will cover for possible pneumonia.

## 2020-04-09 NOTE — ED Notes (Signed)
AVS provided to client, reviewed Rx by ED Provider and the importance of completing all abx as prescribed. Copy of AVS also provided and opportunity for questions

## 2020-04-10 LAB — SARS CORONAVIRUS 2 (TAT 6-24 HRS): SARS Coronavirus 2: NEGATIVE

## 2020-04-12 ENCOUNTER — Other Ambulatory Visit: Payer: Self-pay

## 2020-04-12 ENCOUNTER — Emergency Department (HOSPITAL_BASED_OUTPATIENT_CLINIC_OR_DEPARTMENT_OTHER): Payer: Self-pay

## 2020-04-12 ENCOUNTER — Encounter (HOSPITAL_BASED_OUTPATIENT_CLINIC_OR_DEPARTMENT_OTHER): Payer: Self-pay

## 2020-04-12 DIAGNOSIS — R4182 Altered mental status, unspecified: Secondary | ICD-10-CM | POA: Insufficient documentation

## 2020-04-12 DIAGNOSIS — Z8616 Personal history of COVID-19: Secondary | ICD-10-CM | POA: Insufficient documentation

## 2020-04-12 DIAGNOSIS — R079 Chest pain, unspecified: Secondary | ICD-10-CM | POA: Insufficient documentation

## 2020-04-12 DIAGNOSIS — R109 Unspecified abdominal pain: Secondary | ICD-10-CM | POA: Insufficient documentation

## 2020-04-12 DIAGNOSIS — R531 Weakness: Secondary | ICD-10-CM | POA: Insufficient documentation

## 2020-04-12 DIAGNOSIS — R200 Anesthesia of skin: Secondary | ICD-10-CM | POA: Insufficient documentation

## 2020-04-12 DIAGNOSIS — J3489 Other specified disorders of nose and nasal sinuses: Secondary | ICD-10-CM | POA: Insufficient documentation

## 2020-04-12 DIAGNOSIS — R0602 Shortness of breath: Secondary | ICD-10-CM | POA: Insufficient documentation

## 2020-04-12 DIAGNOSIS — R058 Other specified cough: Secondary | ICD-10-CM | POA: Insufficient documentation

## 2020-04-12 DIAGNOSIS — R1111 Vomiting without nausea: Secondary | ICD-10-CM | POA: Insufficient documentation

## 2020-04-12 DIAGNOSIS — R509 Fever, unspecified: Secondary | ICD-10-CM | POA: Insufficient documentation

## 2020-04-12 NOTE — ED Triage Notes (Signed)
Pt presents with worsening congestion, body aches, sore throat and headache. Was seen here 3 days ago for same with negative covid test. States is taking OTC cold meds with no relief.

## 2020-04-13 ENCOUNTER — Encounter (HOSPITAL_BASED_OUTPATIENT_CLINIC_OR_DEPARTMENT_OTHER): Payer: Self-pay | Admitting: Emergency Medicine

## 2020-04-13 ENCOUNTER — Emergency Department (HOSPITAL_BASED_OUTPATIENT_CLINIC_OR_DEPARTMENT_OTHER)
Admission: EM | Admit: 2020-04-13 | Discharge: 2020-04-13 | Disposition: A | Discharge status: Home / Self Care | Payer: Self-pay | Attending: Emergency Medicine | Admitting: Emergency Medicine

## 2020-04-13 DIAGNOSIS — Z20822 Contact with and (suspected) exposure to covid-19: Secondary | ICD-10-CM

## 2020-04-13 NOTE — ED Notes (Addendum)
Pt declined COVID testing; pt states "I already got one"; pt educated on possible false positive results; pt states that his niece received "breathing treatment and antibiotics" and "felt better" so he said he was confused why he wasn't given anything; EDP made aware

## 2020-04-13 NOTE — Discharge Instructions (Addendum)
Person Under Monitoring Name: Corey Norton  Location: [address] [place] [address]   Infection Prevention Recommendations for Individuals Confirmed to have, or Being Evaluated for, 2019 Novel Coronavirus (COVID-19) Infection Who Receive Care at Home  Individuals who are confirmed to have, or are being evaluated for, COVID-19 should follow the prevention steps below until a healthcare provider or local or state health department says they can return to normal activities.  Stay home except to get medical care You should restrict activities outside your home, except for getting medical care. Do not go to work, school, or public areas, and do not use public transportation or taxis.  Call ahead before visiting your doctor Before your medical appointment, call the healthcare provider and tell them that you have, or are being evaluated for, COVID-19 infection. This will help the healthcare providers office take steps to keep other people from getting infected. Ask your healthcare provider to call the local or state health department.  Monitor your symptoms Seek prompt medical attention if your illness is worsening (e.g., difficulty breathing). Before going to your medical appointment, call the healthcare provider and tell them that you have, or are being evaluated for, COVID-19 infection. Ask your healthcare provider to call the local or state health department.  Wear a facemask You should wear a facemask that covers your nose and mouth when you are in the same room with other people and when you visit a healthcare provider. People who live with or visit you should also wear a facemask while they are in the same room with you.  Separate yourself from other people in your home As much as possible, you should stay in a different room from other people in your home. Also, you should use a separate bathroom, if available.  Avoid sharing household items You should  not share dishes, drinking glasses, cups, eating utensils, towels, bedding, or other items with other people in your home. After using these items, you should wash them thoroughly with soap and water.  Cover your coughs and sneezes Cover your mouth and nose with a tissue when you cough or sneeze, or you can cough or sneeze into your sleeve. Throw used tissues in a lined trash can, and immediately wash your hands with soap and water for at least 20 seconds or use an alcohol-based hand rub.  Wash your [employer] your hands often and thoroughly with soap and water for at least 20 seconds. You can use an alcohol-based hand sanitizer if soap and water are not available and if your hands are not visibly dirty. Avoid touching your eyes, nose, and mouth with unwashed hands.   Prevention Steps for Caregivers and Household Members of Individuals Confirmed to have, or Being Evaluated for, COVID-19 Infection Being Cared for in the Home  If you live with, or provide care at home for, a person confirmed to have, or being evaluated for, COVID-19 infection please follow these guidelines to prevent infection:  Follow healthcare providers instructions Make sure that you understand and can help the patient follow any healthcare provider instructions for all care.  Provide for the patients basic needs You should help the patient with basic needs in the home and provide support for getting groceries, prescriptions, and other personal needs.  Monitor the patients symptoms If they are getting sicker, call his or her medical provider and tell them that the patient has, or is being evaluated for, COVID-19 infection. This will help the healthcare  providers office take steps to keep other people from getting infected. Ask the healthcare provider to call the local or state health department.  Limit the number of people who have contact with the patient If possible, have only one caregiver for the  patient. Other household members should stay in another home or place of residence. If this is not possible, they should stay in another room, or be separated from the patient as much as possible. Use a separate bathroom, if available. Restrict visitors who do not have an essential need to be in the home.  Keep older adults, very young children, and other sick people away from the patient Keep older adults, very young children, and those who have compromised immune systems or chronic health conditions away from the patient. This includes people with chronic heart, lung, or kidney conditions, diabetes, and cancer.  Ensure good ventilation Make sure that shared spaces in the home have good air flow, such as from an air conditioner or an opened window, weather permitting.  Wash your hands often Wash your hands often and thoroughly with soap and water for at least 20 seconds. You can use an alcohol based hand sanitizer if soap and water are not available and if your hands are not visibly dirty. Avoid touching your eyes, nose, and mouth with unwashed hands. Use disposable paper towels to dry your hands. If not available, use dedicated cloth towels and replace them when they become wet.  Wear a facemask and gloves Wear a disposable facemask at all times in the room and gloves when you touch or have contact with the patients blood, body fluids, and/or secretions or excretions, such as sweat, saliva, sputum, nasal mucus, vomit, urine, or feces.  Ensure the mask fits over your nose and mouth tightly, and do not touch it during use. Throw out disposable facemasks and gloves after using them. Do not reuse. Wash your hands immediately after removing your facemask and gloves. If your personal clothing becomes contaminated, carefully remove clothing and launder. Wash your hands after handling contaminated clothing. Place all used disposable facemasks, gloves, and other waste in a lined container before  disposing them with other household waste. Remove gloves and wash your hands immediately after handling these items.  Do not share dishes, glasses, or other household items with the patient Avoid sharing household items. You should not share dishes, drinking glasses, cups, eating utensils, towels, bedding, or other items with a patient who is confirmed to have, or being evaluated for, COVID-19 infection. After the person uses these items, you should wash them thoroughly with soap and water.  Wash laundry thoroughly Immediately remove and wash clothes or bedding that have blood, body fluids, and/or secretions or excretions, such as sweat, saliva, sputum, nasal mucus, vomit, urine, or feces, on them. Wear gloves when handling laundry from the patient. Read and follow directions on labels of laundry or clothing items and detergent. In general, wash and dry with the warmest temperatures recommended on the label.  Clean all areas the individual has used often Clean all touchable surfaces, such as counters, tabletops, doorknobs, bathroom fixtures, toilets, phones, keyboards, tablets, and bedside tables, every day. Also, clean any surfaces that may have blood, body fluids, and/or secretions or excretions on them. Wear gloves when cleaning surfaces the patient has come in contact with. Use a diluted bleach solution (e.g., dilute bleach with 1 part bleach and 10 parts water) or a household disinfectant with a label that says EPA-registered for coronaviruses. To make  a bleach solution at home, add 1 tablespoon of bleach to 1 quart (4 cups) of water. For a larger supply, add  cup of bleach to 1 gallon (16 cups) of water. Read labels of cleaning products and follow recommendations provided on product labels. Labels contain instructions for safe and effective use of the cleaning product including precautions you should take when applying the product, such as wearing gloves or eye protection and making sure you  have good ventilation during use of the product. Remove gloves and wash hands immediately after cleaning.  Monitor yourself for signs and symptoms of illness Caregivers and household members are considered close contacts, should monitor their health, and will be asked to limit movement outside of the home to the extent possible. Follow the monitoring steps for close contacts listed on the symptom monitoring form.   ? If you have additional questions, contact your local health department or call the epidemiologist on call at [phone] (available 24/7). ? This guidance is subject to change. For the most up-to-date guidance from [facility], please refer to their website: [email or web address]

## 2020-04-13 NOTE — ED Notes (Signed)
EDP at bedside  

## 2020-04-13 NOTE — ED Provider Notes (Signed)
[facility] EMERGENCY DEPARTMENT Provider Note   CSN: [id number] Arrival date & time: 04/12/20  1548     History Chief Complaint  Patient presents with  . URI    Corey Norton is a 60 y.o. male.  The history is provided by the patient.  URI Presenting symptoms: congestion, cough and rhinorrhea   Presenting symptoms: no fever   Severity:  Mild Onset quality:  Gradual Duration:  1 week Timing:  Constant Progression:  Unchanged Chronicity:  New Relieved by:  Nothing Worsened by:  Nothing Ineffective treatments:  Belching Associated symptoms: no arthralgias   Risk factors: not elderly   Seen for same over the weekend and symptoms have not resolved.  Family with same.        Past Medical History:  Diagnosis Date  . BPH (benign prostatic hyperplasia)   . Chronic prostatitis   . COVID-19 04/2019   outpatient pneumonitis  . Elevated PSA   . Hyperlipidemia     Patient Active Problem List   Diagnosis Date Noted  . Arthritis 11/03/2019  . Elevated PSA 11/03/2019  . Benign prostatic hyperplasia with lower urinary tract symptoms 11/03/2019  . Prostatitis, chronic 11/03/2019  . Hyperlipidemia 11/03/2019  . COVID-19 04/2019    Past Surgical History:  Procedure Laterality Date  . KNEE SURGERY         Family History  Problem Relation Age of Onset  . Cancer Father        Prostate--cause of death  . Prostatitis Brother        vs BPH    Social History   Tobacco Use  . Smoking status: Never Smoker  . Smokeless tobacco: Never Used  Substance Use Topics  . Alcohol use: Yes  . Drug use: Never    Home Medications Prior to Admission medications   Medication Sig Start Date End Date Taking? Authorizing Provider  acetaminophen (TYLENOL) 500 MG tablet Take 1,000 mg by mouth every 6 (six) hours as needed. At bedtime    [provider]  ciprofloxacin (CIPRO) 500 MG tablet Take 500 mg by mouth 2 (two) times daily. 14 days    [provider]   doxycycline (VIBRAMYCIN) 100 MG capsule Take 1 capsule (100 mg total) by mouth 2 (two) times daily for 10 days. 04/09/20 04/19/20  Curatolo, Adam, DO  finasteride (PROSCAR) 5 MG tablet Take 1 tablet (5 mg total) by mouth daily. 11/03/19   Julieanne Manson, MD  ibuprofen (ADVIL) 200 MG tablet Take 200 mg by mouth every 6 (six) hours as needed. 2 tabs by mouth at bedtime    [provider]  tamsulosin (FLOMAX) 0.4 MG CAPS capsule Take 0.4 mg by mouth.    [provider]    Allergies    Patient has no known allergies.  Review of Systems   Review of Systems  Constitutional: Negative for fever.  HENT: Positive for congestion and rhinorrhea.   Eyes: Negative for visual disturbance.  Respiratory: Positive for cough.   Gastrointestinal: Negative for abdominal distention.  Genitourinary: Negative for difficulty urinating.  Musculoskeletal: Negative for arthralgias.  Skin: Negative for rash.  Neurological: Negative for dizziness.  Psychiatric/Behavioral: Negative for agitation.    Physical Exam Updated Vital Signs BP (!) 169/100 (BP Location: Left Arm)   Pulse 80   Temp 98.7 F (37.1 C) (Oral)   Resp 20   Ht 5\' 9"  (1.753 m)   Wt 89.4 kg   SpO2 98%   BMI 29.09 kg/m  Physical Exam Vitals and nursing note reviewed.  Constitutional:      General: He is not in acute distress.    Appearance: Normal appearance.  HENT:     Head: Normocephalic and atraumatic.  Eyes:     Conjunctiva/sclera: Conjunctivae normal.     Pupils: Pupils are equal, round, and reactive to light.  Cardiovascular:     Rate and Rhythm: Normal rate and regular rhythm.     Pulses: Normal pulses.     Heart sounds: Normal heart sounds.  Pulmonary:     Effort: Pulmonary effort is normal.     Breath sounds: Normal breath sounds.  Abdominal:     General: Abdomen is flat. Bowel sounds are normal.     Palpations: Abdomen is soft.     Tenderness: There is no abdominal tenderness. There is no guarding  or rebound.  Musculoskeletal:        General: Normal range of motion.     Cervical back: Normal range of motion and neck supple.  Skin:    General: Skin is warm and dry.     Capillary Refill: Capillary refill takes less than 2 seconds.  Neurological:     General: No focal deficit present.     Mental Status: He is alert and oriented to person, place, and time.     Deep Tendon Reflexes: Reflexes normal.  Psychiatric:        Mood and Affect: Mood normal.        Behavior: Behavior normal.     ED Results / Procedures / Treatments   Labs (all labs ordered are listed, but only abnormal results are displayed) Labs Reviewed - No data to display  EKG None  Radiology DG Chest Portable 1 View  Result Date: 04/12/2020 CLINICAL DATA:  Worsening congestion EXAM: PORTABLE CHEST 1 VIEW COMPARISON:  04/09/2020 FINDINGS: Heart and mediastinal contours are within normal limits. No focal opacities or effusions. No acute bony abnormality. IMPRESSION: No active disease. Electronically Signed   By: Charlett Nose M.D.   On: 04/12/2020 23:35    Procedures Procedures (including critical care time)  Medications Ordered in ED Medications - No data to display  ED Course  I have reviewed the triage vital signs and the nursing notes.  Pertinent labs & imaging results that were available during my care of the patient were reviewed by me and considered in my medical decision making (see chart for details).    Will covid swab, start zyrtec.  Well appearing.  No signs of acute bacterial illness. Steroids did not help.   Corey Norton was evaluated in Emergency Department on 04/13/2020 for the symptoms described in the history of present illness. He was evaluated in the context of the global COVID-19 pandemic, which necessitated consideration that the patient might be at risk for infection with the SARS-CoV-2 virus that causes COVID-19. Institutional protocols and algorithms that pertain to the evaluation of  patients at risk for COVID-19 are in a state of rapid change based on information released by regulatory bodies including the CDC and federal and state organizations. These policies and algorithms were followed during the patient's care in the ED.  Final Clinical Impression(s) / ED Diagnoses Return for intractable cough, coughing up blood,fevers >100.4 unrelieved by medication, shortness of breath, intractable vomiting, chest pain, shortness of breath, weakness,numbness, changes in speech, facial asymmetry,abdominal pain, passing out,Inability to tolerate liquids or food, cough, altered mental status or any concerns. No signs of systemic illness or infection. The patient  is nontoxic-appearing on exam and vital signs are within normal limits.   I have reviewed the triage vital signs and the nursing notes. Pertinent labs &imaging results that were available during my care of the patient were reviewed by me and considered in my medical decision making (see chart for details).After history, exam, and medical workup I feel the patient has beenappropriately medically screened and is safe for discharge home. Pertinent diagnoses were discussed with the patient. Patient was given return precautions.      Jud Fanguy, MD 04/13/20 [id number]

## 2020-12-13 ENCOUNTER — Emergency Department (HOSPITAL_COMMUNITY)
Admission: EM | Admit: 2020-12-13 | Discharge: 2020-12-14 | Disposition: A | Discharge status: Home / Self Care | Payer: Self-pay | Attending: Emergency Medicine | Admitting: Emergency Medicine

## 2020-12-13 ENCOUNTER — Emergency Department (HOSPITAL_COMMUNITY): Payer: Self-pay

## 2020-12-13 ENCOUNTER — Other Ambulatory Visit: Payer: Self-pay

## 2020-12-13 DIAGNOSIS — Z8616 Personal history of COVID-19: Secondary | ICD-10-CM | POA: Insufficient documentation

## 2020-12-13 DIAGNOSIS — J40 Bronchitis, not specified as acute or chronic: Secondary | ICD-10-CM | POA: Insufficient documentation

## 2020-12-13 NOTE — ED Triage Notes (Addendum)
Pt c/o shortness of breath for the past 28 days. Pt states it has been like this since he had covid last year. Pt also c/o chest pain.

## 2020-12-14 LAB — CBC WITH DIFFERENTIAL/PLATELET
Abs Immature Granulocytes: 0.03 10*3/uL (ref 0.00–0.07)
Basophils Absolute: 0 10*3/uL (ref 0.0–0.1)
Basophils Relative: 1 %
Eosinophils Absolute: 0.3 10*3/uL (ref 0.0–0.5)
Eosinophils Relative: 3 %
HCT: 46 % (ref 39.0–52.0)
Hemoglobin: 14.8 g/dL (ref 13.0–17.0)
Immature Granulocytes: 0 %
Lymphocytes Relative: 39 %
Lymphs Abs: 3.2 10*3/uL (ref 0.7–4.0)
MCH: 26.1 pg (ref 26.0–34.0)
MCHC: 32.2 g/dL (ref 30.0–36.0)
MCV: 81.3 fL (ref 80.0–100.0)
Monocytes Absolute: 0.8 10*3/uL (ref 0.1–1.0)
Monocytes Relative: 10 %
Neutro Abs: 3.7 10*3/uL (ref 1.7–7.7)
Neutrophils Relative %: 47 %
Platelets: 277 10*3/uL (ref 150–400)
RBC: 5.66 MIL/uL (ref 4.22–5.81)
RDW: 14.1 % (ref 11.5–15.5)
WBC: 8.1 10*3/uL (ref 4.0–10.5)
nRBC: 0 % (ref 0.0–0.2)

## 2020-12-14 LAB — COMPREHENSIVE METABOLIC PANEL
ALT: 26 U/L (ref 0–44)
AST: 22 U/L (ref 15–41)
Albumin: 3.8 g/dL (ref 3.5–5.0)
Alkaline Phosphatase: 33 U/L — ABNORMAL LOW (ref 38–126)
Anion gap: 6 (ref 5–15)
BUN: 11 mg/dL (ref 6–20)
CO2: 27 mmol/L (ref 22–32)
Calcium: 9.3 mg/dL (ref 8.9–10.3)
Chloride: 103 mmol/L (ref 98–111)
Creatinine, Ser: 1.01 mg/dL (ref 0.61–1.24)
GFR, Estimated: >60 mL/min (ref >60)
Glucose, Bld: 104 mg/dL — ABNORMAL HIGH (ref 70–99)
Potassium: 3.6 mmol/L (ref 3.5–5.1)
Sodium: 136 mmol/L (ref 135–145)
Total Bilirubin: 0.5 mg/dL (ref 0.3–1.2)
Total Protein: 6.6 g/dL (ref 6.5–8.1)

## 2020-12-14 LAB — TROPONIN I (HIGH SENSITIVITY)
Troponin I (High Sensitivity): 6 ng/L (ref <18)
Troponin I (High Sensitivity): 5 ng/L (ref <18)

## 2020-12-14 MED ORDER — IPRATROPIUM BROMIDE HFA 17 MCG/ACT IN AERS
2.0000 | INHALATION_SPRAY | Freq: Once | RESPIRATORY_TRACT | Status: AC
  Administered 2020-12-14: 2 via RESPIRATORY_TRACT
  Filled 2020-12-14: qty 12.9

## 2020-12-14 MED ORDER — PREDNISONE 10 MG PO TABS: 40.0000 mg | ORAL_TABLET | Freq: Every day | ORAL | 0 refills | Status: AC

## 2020-12-14 MED ORDER — ALBUTEROL SULFATE HFA 108 (90 BASE) MCG/ACT IN AERS
1.0000 | INHALATION_SPRAY | Freq: Once | RESPIRATORY_TRACT | Status: AC
  Administered 2020-12-14: 2 via RESPIRATORY_TRACT
  Filled 2020-12-14: qty 6.7

## 2020-12-14 NOTE — ED Notes (Signed)
Pt ambulatory without difficulty, gait steady. SpO2 99% while ambulating. Pt endorses having trouble breathing while ambulating, appears in NAD while ambulating and at rest.

## 2020-12-14 NOTE — ED Notes (Signed)
Pt verbalizes understanding of discharge instructions. Opportunity for questions and answers were provided. Pt discharged from the ED.   ?

## 2020-12-14 NOTE — Discharge Instructions (Addendum)
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 Take 1-2 puffs of your albuterol inhaler every 6 hours as needed for symptoms. Take prednisone as prescribed. Follow-up with your primary care provider for continuing symptomatic management. Return if symptoms worsen.

## 2020-12-14 NOTE — ED Provider Notes (Signed)
[facility] EMERGENCY DEPARTMENT Provider Note   CSN: [id number] Arrival date & time: 12/13/20  2203     History Chief Complaint  Patient presents with   Shortness of Breath    Corey Norton is a 60 y.o. male.  Patient presents today with complaint of shortness of breath. Patient states that this has been going on for a month. He had COVID in January and says 'my lungs just haven't been the same since.' States that this particular episode began after an emotional life event. He is normally able to ambulate without difficulty, but recently has been getting more short of breath at his job that requires significant walking and carrying heavy items. Accompanied with non-productive cough. He tried Bronkaid without relief. Also endorses some pleuritic chest pain that he states is tight in nature and does not radiate. Denies fevers, chills, PND, palpitations, leg swelling, leg pain, recent travel, or history of malignancy.   The history is provided by the patient. No language interpreter was used.  Shortness of Breath Associated symptoms: chest pain and cough   Associated symptoms: no abdominal pain, no diaphoresis, no fever, no headaches, no rash, no vomiting and no wheezing       Past Medical History:  Diagnosis Date   BPH (benign prostatic hyperplasia)    Chronic prostatitis    COVID-19 04/2019   outpatient pneumonitis   Elevated PSA    Hyperlipidemia     Patient Active Problem List   Diagnosis Date Noted   Arthritis 11/03/2019   Elevated PSA 11/03/2019   Benign prostatic hyperplasia with lower urinary tract symptoms 11/03/2019   Prostatitis, chronic 11/03/2019   Hyperlipidemia 11/03/2019   COVID-19 04/2019    Past Surgical History:  Procedure Laterality Date   KNEE SURGERY         Family History  Problem Relation Age of Onset   Cancer Father        Prostate--cause of death   Prostatitis Brother        vs BPH    Social History   Tobacco Use    Smoking status: Never   Smokeless tobacco: Never  Substance Use Topics   Alcohol use: Yes   Drug use: Never    Home Medications Prior to Admission medications   Medication Sig Start Date End Date Taking? Authorizing Provider  acetaminophen (TYLENOL) 500 MG tablet Take 1,000 mg by mouth every 6 (six) hours as needed. At bedtime    [provider]  ciprofloxacin (CIPRO) 500 MG tablet Take 500 mg by mouth 2 (two) times daily. 14 days    [provider]  finasteride (PROSCAR) 5 MG tablet Take 1 tablet (5 mg total) by mouth daily. 11/03/19   Julieanne Manson, MD  ibuprofen (ADVIL) 200 MG tablet Take 200 mg by mouth every 6 (six) hours as needed. 2 tabs by mouth at bedtime    [provider]  tamsulosin (FLOMAX) 0.4 MG CAPS capsule Take 0.4 mg by mouth.    [provider]    Allergies    Patient has no known allergies.  Review of Systems   Review of Systems  Constitutional:  Negative for chills, diaphoresis, fever and unexpected weight change.  HENT:  Negative for congestion, postnasal drip, rhinorrhea, sinus pressure and sinus pain.   Respiratory:  Positive for cough, chest tightness and shortness of breath. Negative for apnea, choking, wheezing and stridor.   Cardiovascular:  Positive for chest pain. Negative for palpitations and leg swelling.  Gastrointestinal:  Negative for abdominal pain, diarrhea, nausea and vomiting.  Musculoskeletal:  Negative for arthralgias, gait problem and myalgias.  Skin:  Negative for pallor and rash.  Neurological:  Negative for dizziness, tremors, seizures, syncope, facial asymmetry, speech difficulty, weakness, light-headedness, numbness and headaches.  Psychiatric/Behavioral:  Negative for confusion and decreased concentration.   All other systems reviewed and are negative.  Physical Exam Updated Vital Signs BP (!) 164/113 (BP Location: Right Arm)   Pulse 83   Temp 98.5 F (36.9 C) (Oral)   Resp (!) 21   Ht 5'  9" (1.753 m)   Wt 88 kg   SpO2 100%   BMI 28.65 kg/m   Physical Exam Vitals and nursing note reviewed.  Constitutional:      General: He is not in acute distress.    Appearance: Normal appearance. He is not ill-appearing, toxic-appearing or diaphoretic.  HENT:     Head: Normocephalic.     Mouth/Throat:     Mouth: Mucous membranes are moist.  Eyes:     General: No scleral icterus. Cardiovascular:     Rate and Rhythm: Normal rate and regular rhythm.     Heart sounds: No murmur heard. Pulmonary:     Effort: Pulmonary effort is normal. No respiratory distress.     Breath sounds: Normal breath sounds. No stridor. No decreased breath sounds, wheezing, rhonchi or rales.  Chest:     Chest wall: No mass or tenderness.  Abdominal:     General: Abdomen is flat.     Palpations: Abdomen is soft.  Musculoskeletal:        General: Normal range of motion.     Cervical back: Normal range of motion.     Right lower leg: No tenderness. No edema.     Left lower leg: No tenderness. No edema.  Skin:    General: Skin is warm and dry.  Neurological:     General: No focal deficit present.     Mental Status: He is alert.     Motor: No weakness.  Psychiatric:        Mood and Affect: Mood normal.        Behavior: Behavior normal.    ED Results / Procedures / Treatments   Labs (all labs ordered are listed, but only abnormal results are displayed) Labs Reviewed  CBC WITH DIFFERENTIAL/PLATELET  COMPREHENSIVE METABOLIC PANEL  TROPONIN I (HIGH SENSITIVITY)    EKG EKG Interpretation  Date/Time:  Tuesday December 13 2020 22:14:17 EDT Ventricular Rate:  102 PR Interval:  144 QRS Duration: 78 QT Interval:  348 QTC Calculation: 453 R Axis:   0 Text Interpretation: Sinus tachycardia Otherwise normal ECG Confirmed by Gilda Crease [phone]) on 12/14/2020 6:52:00 AM  Radiology DG Chest 2 View  Result Date: 12/13/2020 CLINICAL DATA:  Short of breath for 1 month, chest pain EXAM:  CHEST - 2 VIEW COMPARISON:  04/12/2020 FINDINGS: The heart size and mediastinal contours are within normal limits. Both lungs are clear. The visualized skeletal structures are unremarkable. IMPRESSION: No active cardiopulmonary disease. Electronically Signed   By: Sharlet Salina M.D.   On: 12/13/2020 23:49    Procedures Procedures   Medications Ordered in ED Medications  albuterol (VENTOLIN HFA) 108 (90 Base) MCG/ACT inhaler 1-2 puff (has no administration in time range)  ipratropium (ATROVENT HFA) inhaler 2 puff (has no administration in time range)    ED Course  I have reviewed the triage vital signs and the nursing notes.  Pertinent  labs & imaging results that were available during my care of the patient were reviewed by me and considered in my medical decision making (see chart for details).    MDM Rules/Calculators/A&P                         Patient presents to the ED with complaints of shortness of breath. Nontoxic, vitals unremarkable.  Patient is afebrile, nontoxic-appearing, and in no acute distress.    Ddx: The emergent differential diagnosis for shortness of breath includes, but is not limited to, Pulmonary edema, bronchoconstriction, Pneumonia, Pulmonary embolism, Pneumotherax/ Hemothorax, Dysrythmia, ACS.   Additional history obtained:  Additional history obtained from chart review & nursing note review.   Lab Tests:  I Ordered, reviewed, and interpreted labs, which included:  CBC, CMP, troponin Findings unremarkable, no leukocytosis or anemia   Imaging Studies ordered:  I ordered imaging studies which included CXR, I independently reviewed, formal radiology impression shows:  No active cardiopulmomary disease. Lung sounds equal and bilateral in all lobes, low suspicion for pulmonary edema, pneumonia, or pneumothorax/hemothorax  ED Course:  Patient presents with shortness of breath for 1 month with previous intermittent episodes of same since COVID infection in  January. Well's score of 0, unable to use PERC due only to age. No leg swelling, long trips, surgeries, or malignancy. Patient ambulated in the room, oxygen saturation noted to be 95% and above throughout, no tachycardia noted. EKG and troponins negative, low suspicion for ACS or arrythmia. Pt CXR negative for acute infiltrate. Due to longevity of symptoms following COVID infection combined with non-productive cough and clear lung sounds, patients symptoms are consistent with bronchitis.  Discussed that antibiotics are not indicated. Patient also noted improvement with inhaler.  Will discharge patient with this, as well as short dose of steroids for inflammation. Verbalizes understanding and is agreeable with plan. Pt is hemodynamically stable & in NAD prior to dc.  Findings and plan of care discussed with supervising physician Dr. Adela Lank who is in agreement.    Portions of this note were generated with Scientist, clinical (histocompatibility and immunogenetics). Dictation errors may occur despite best attempts at proofreading.   Final Clinical Impression(s) / ED Diagnoses Final diagnoses:  Bronchitis    Rx / DC Orders ED Discharge Orders          Ordered    predniSONE (DELTASONE) 10 MG tablet  Daily        12/14/20 0933          An After Visit Summary was printed and given to the patient.    Vear Clock 12/14/20 1116    Melene Plan, DO 12/14/20 1451

## 2021-05-04 NOTE — Progress Notes (Signed)
Synopsis: Referred for dyspnea by self  Subjective:   PATIENT ID: Corey Norton GENDER: male DOB: 1960/11/18, MRN: [id number]  Chief Complaint  Patient presents with   Pulmonary Consult    Self referral. Pt c/o DOE, cough, wheezing and chest tightness- onset Jan 2021 after dx with covid 19 and pna. His cough is occ prod with clear to yellow sputum. He has tried pred without relief. Albuterol only helps cough minimally.    61yM no history of asthma, with history of covid-19 infection 04/2019. Was not hospitalized. Not vaccinated for covid-19  He says it took about 7 weeks to recover strength after covid-19 infection. But eventually developed chest tightness, wheeze. Which has since been recurrent, intermittent issue for him. He doesn't think prednisone was helpful for one of these episodes in January of this year. Albuterol minimally helpful. Has had intermittent issues with sinonasal drainage - using antihistamine. Gets reflux maybe 1-2x per week, has tried working on lifestyle measures.  He has not had PFTs.   Otherwise pertinent review of systems is negative.  No family history of lung disease  Never smoked cigarettes, did smoke MJ as a teenager long ago. No vaping. 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   Past Medical History:  Diagnosis Date   BPH (benign prostatic hyperplasia)    Chronic prostatitis    COVID-19 04/2019   outpatient pneumonitis   Elevated PSA    Hyperlipidemia      Family History  Problem Relation Age of Onset   Cancer Father        Prostate--cause of death   Prostatitis Brother        vs BPH     Past Surgical History:  Procedure Laterality Date   KNEE SURGERY      Social History   Socioeconomic History   Marital status: Single    Spouse name: Not on file   Number of children: Not on file   Years of education: Not on file   Highest education level: Not on file  Occupational History   Not on file  Tobacco Use   Smoking status: Never   Smokeless  tobacco: Never  Vaping Use   Vaping Use: Not on file  Substance and Sexual Activity   Alcohol use: Yes   Drug use: Never   Sexual activity: Not on file  Other Topics Concern   Not on file  Social History Narrative   Not on file   Social Determinants of Health   Financial Resource Strain: Not on file  Food Insecurity: Not on file  Transportation Needs: Not on file  Physical Activity: Not on file  Stress: Not on file  Social Connections: Not on file  Intimate Partner Violence: Not on file     No Known Allergies   Outpatient Medications Prior to Visit  Medication Sig Dispense Refill   albuterol (VENTOLIN HFA) 108 (90 Base) MCG/ACT inhaler Inhale 2 puffs into the lungs every 6 (six) hours as needed for wheezing or shortness of breath.     ibuprofen (ADVIL) 200 MG tablet Take 200 mg by mouth every 6 (six) hours as needed. 2 tabs by mouth at bedtime     acetaminophen (TYLENOL) 500 MG tablet Take 1,000 mg by mouth every 6 (six) hours as needed. At bedtime     ciprofloxacin (CIPRO) 500 MG tablet Take 500 mg by mouth 2 (two) times daily. 14 days     finasteride (PROSCAR) 5 MG tablet Take 1 tablet (5 mg total) by  mouth daily. 30 tablet 11   tamsulosin (FLOMAX) 0.4 MG CAPS capsule Take 0.4 mg by mouth.     No facility-administered medications prior to visit.       Objective:   Physical Exam:  General appearance: 61 y.o., male, NAD, conversant  Eyes: anicteric sclerae; PERRL, tracking appropriately HENT: NCAT; MMM Neck: Trachea midline; no lymphadenopathy, no JVD Lungs: CTAB, no crackles, no wheeze, with normal respiratory effort CV: RRR, no murmur  Abdomen: Soft, non-tender; non-distended, BS present  Extremities: No peripheral edema, warm Skin: Normal turgor and texture; no rash Psych: Appropriate affect Neuro: Alert and oriented to person and place, no focal deficit     Vitals:   05/05/21 1542  BP: (!) 140/92  Pulse: 75  Temp: 97.9 F (36.6 C)  TempSrc: Oral   SpO2: 98%  Weight: 187 lb (84.8 kg)  Height: 5\' 8"  (1.727 m)   98% on RA BMI Readings from Last 3 Encounters:  05/05/21 28.43 kg/m  12/13/20 28.65 kg/m  04/12/20 29.09 kg/m   Wt Readings from Last 3 Encounters:  05/05/21 187 lb (84.8 kg)  12/13/20 194 lb (88 kg)  04/12/20 197 lb (89.4 kg)     CBC    Component Value Date/Time   WBC 8.1 12/14/2020 0740   RBC 5.66 12/14/2020 0740   HGB 14.8 12/14/2020 0740   HCT 46.0 12/14/2020 0740   PLT 277 12/14/2020 0740   MCV 81.3 12/14/2020 0740   MCH 26.1 12/14/2020 0740   MCHC 32.2 12/14/2020 0740   RDW 14.1 12/14/2020 0740   LYMPHSABS 3.2 12/14/2020 0740   MONOABS 0.8 12/14/2020 0740   EOSABS 0.3 12/14/2020 0740   BASOSABS 0.0 12/14/2020 0740    Chest Imaging: CXR 05/05/21 reviewed by me. I question whether there are diffuse subtle ggo/alveolar opacity  CXR 12/13/20 reviewed by me unremarkable save for hypoinflation  CTA Chest 06/09/19 from [facility] report reviewed by me: Lungs: Minimal basilar dependent changes.  No consolidation.  Pleural spaces: No pleural effusion. Pleural based 2 foci along the fissure  series 7, image 40 likely and bilaterally typically are benign.. No follow-up  suggested. No pneumonia.  Heart:  No pericardial effusion.  Lymph nodes:  No enlarged lymph nodes.    Pulmonary Functions Testing Results: No flowsheet data found.      Assessment & Plan:   # Episodic dyspnea, cough, wheeze; History suggestive of asthma or covid-related reactive airways/small airways disease. Control may be worse in setting recurrent post-nasal drainage/rhinitis. I do wonder if his CXR today however has widespread faint ggo/alveolar opacification - awaiting final read.  Plan: - cbc/diff, IgE, BNP - PFT, hold albuterol for 2 days before PFT - CXR today reviewed as above - awaiting final radiology read. If suspicious for widespread subtle ggo, will go ahead and order CT Chest, if not then await PFTs and if DLCO low then  would order HRCT Chest. - prn albuterol - flonase 1 spray each nare after clearing nose of crusting following shower - xyzal 5 mg daily   RTC 2-4 weeks with PFT      08/09/19, MD [facility] 05/05/2021 4:38 PM

## 2021-05-05 ENCOUNTER — Ambulatory Visit (INDEPENDENT_AMBULATORY_CARE_PROVIDER_SITE_OTHER): Payer: 59

## 2021-05-05 ENCOUNTER — Encounter: Payer: Self-pay | Admitting: Student

## 2021-05-05 ENCOUNTER — Other Ambulatory Visit: Payer: Self-pay

## 2021-05-05 ENCOUNTER — Ambulatory Visit (INDEPENDENT_AMBULATORY_CARE_PROVIDER_SITE_OTHER): Payer: 59 | Admitting: Student

## 2021-05-05 VITALS — BP 140/92 | HR 75 | Temp 97.9°F | Ht 68.0 in | Wt 187.0 lb

## 2021-05-05 DIAGNOSIS — R053 Chronic cough: Secondary | ICD-10-CM

## 2021-05-05 DIAGNOSIS — R0600 Dyspnea, unspecified: Secondary | ICD-10-CM

## 2021-05-05 MED ORDER — FLUTICASONE PROPIONATE 50 MCG/ACT NA SUSP: 1.0000 | Freq: Every day | NASAL | 2 refills | Status: AC

## 2021-05-05 MED ORDER — LEVOCETIRIZINE DIHYDROCHLORIDE 5 MG PO TABS: 5.0000 mg | ORAL_TABLET | Freq: Every evening | ORAL | 11 refills | Status: DC

## 2021-05-05 NOTE — Patient Instructions (Signed)
-   chest x ray today - labs today or next visit - breathing tests (PFTs) next visit - need to be off albuterol for 2 days before PFT - flonase 1 spray each nostril after clearing nose of crusting following shower every day. Stop if you have nosebleed - xyzal 1 tablet daily till next appointment - See you in 2-4 weeks

## 2021-06-01 ENCOUNTER — Ambulatory Visit: Payer: 59 | Admitting: Adult Health

## 2021-06-13 ENCOUNTER — Ambulatory Visit: Payer: Self-pay | Admitting: Adult Health

## 2021-06-30 ENCOUNTER — Ambulatory Visit: Payer: Self-pay | Admitting: Adult Health

## 2021-08-01 IMAGING — CR DG CHEST 2V
2 series · 2 of 2 positions shown · non-contrast
Comparison: None

CLINICAL DATA: 59-year-old male with chest pain.

EXAM:
CHEST - 2 VIEW

[chest pa]
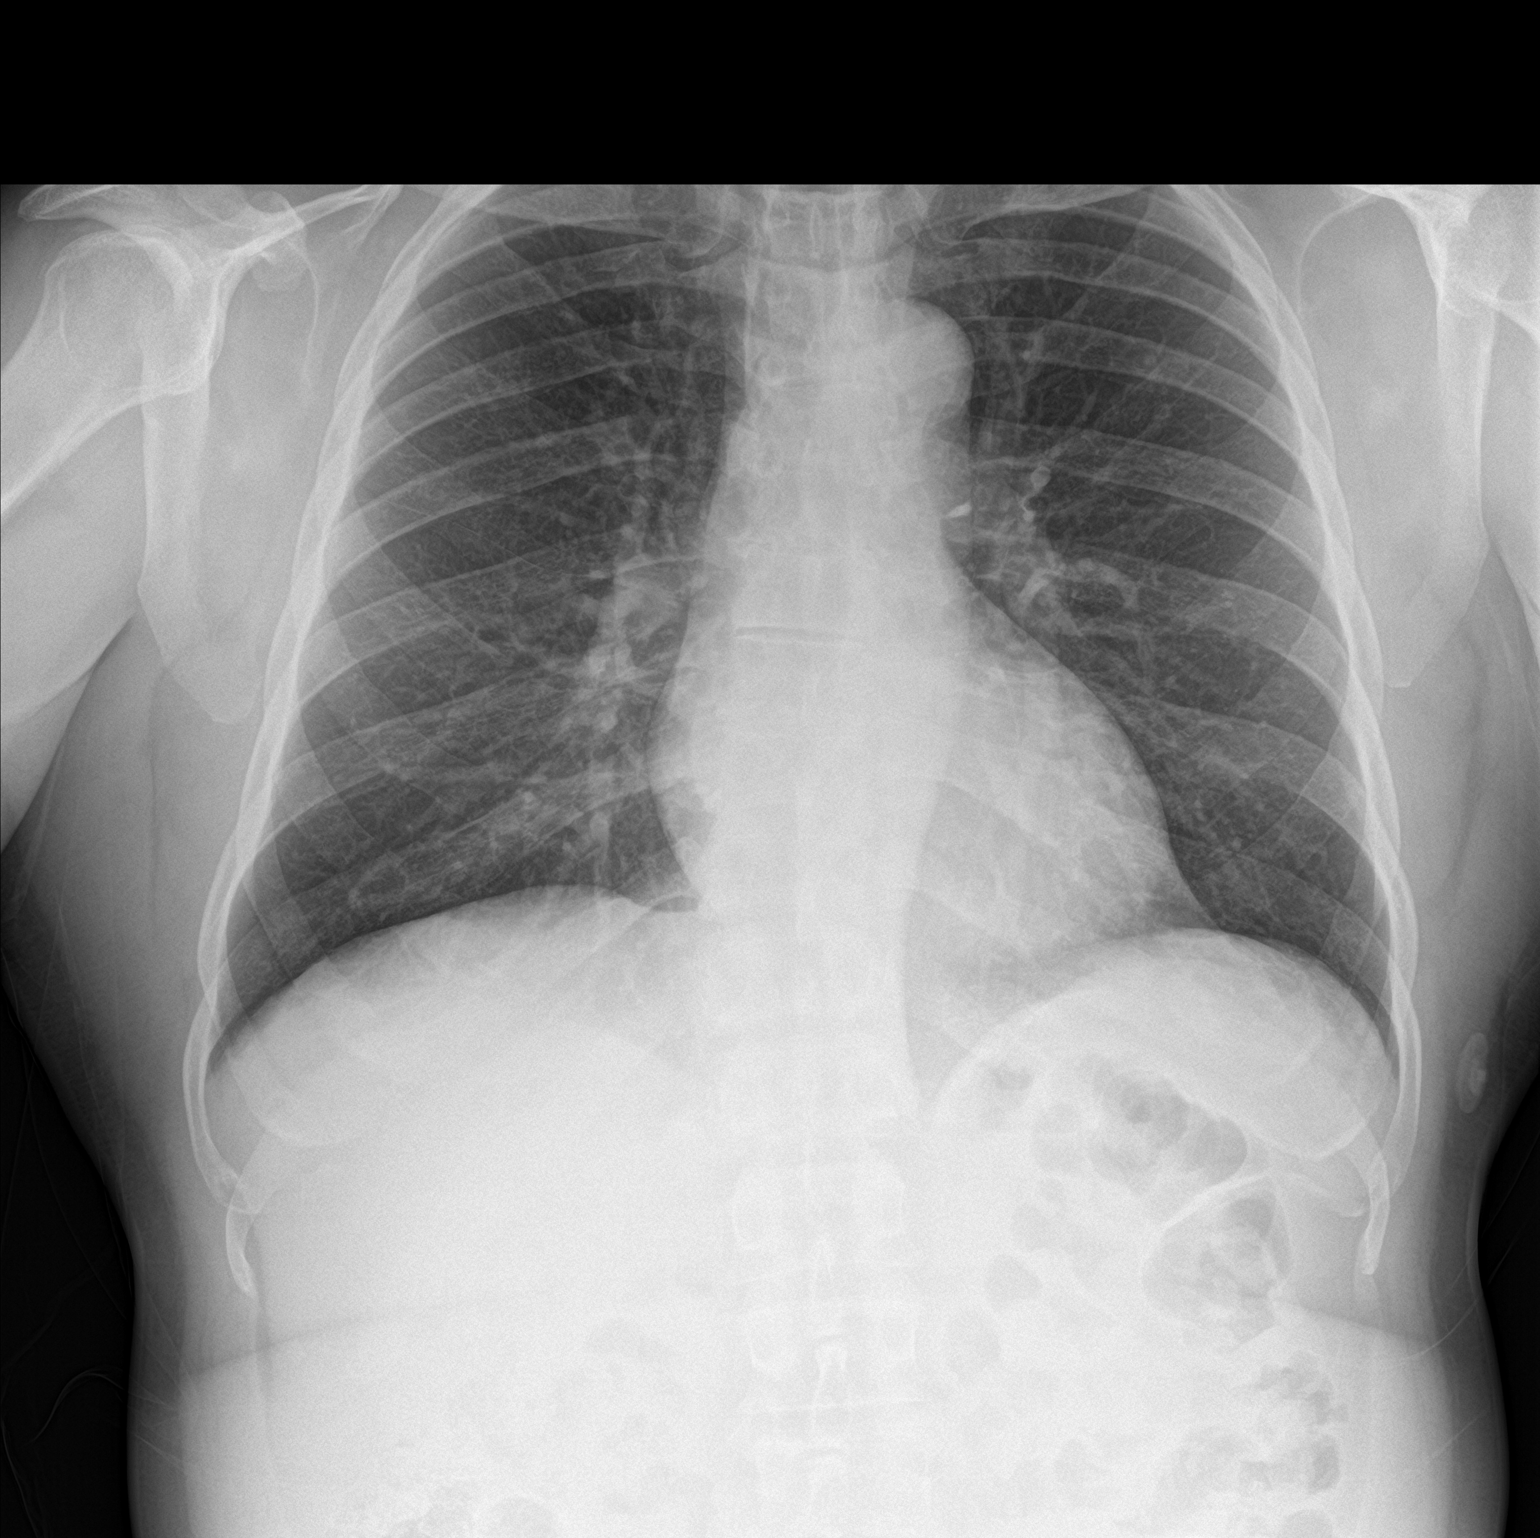

[chest lat]
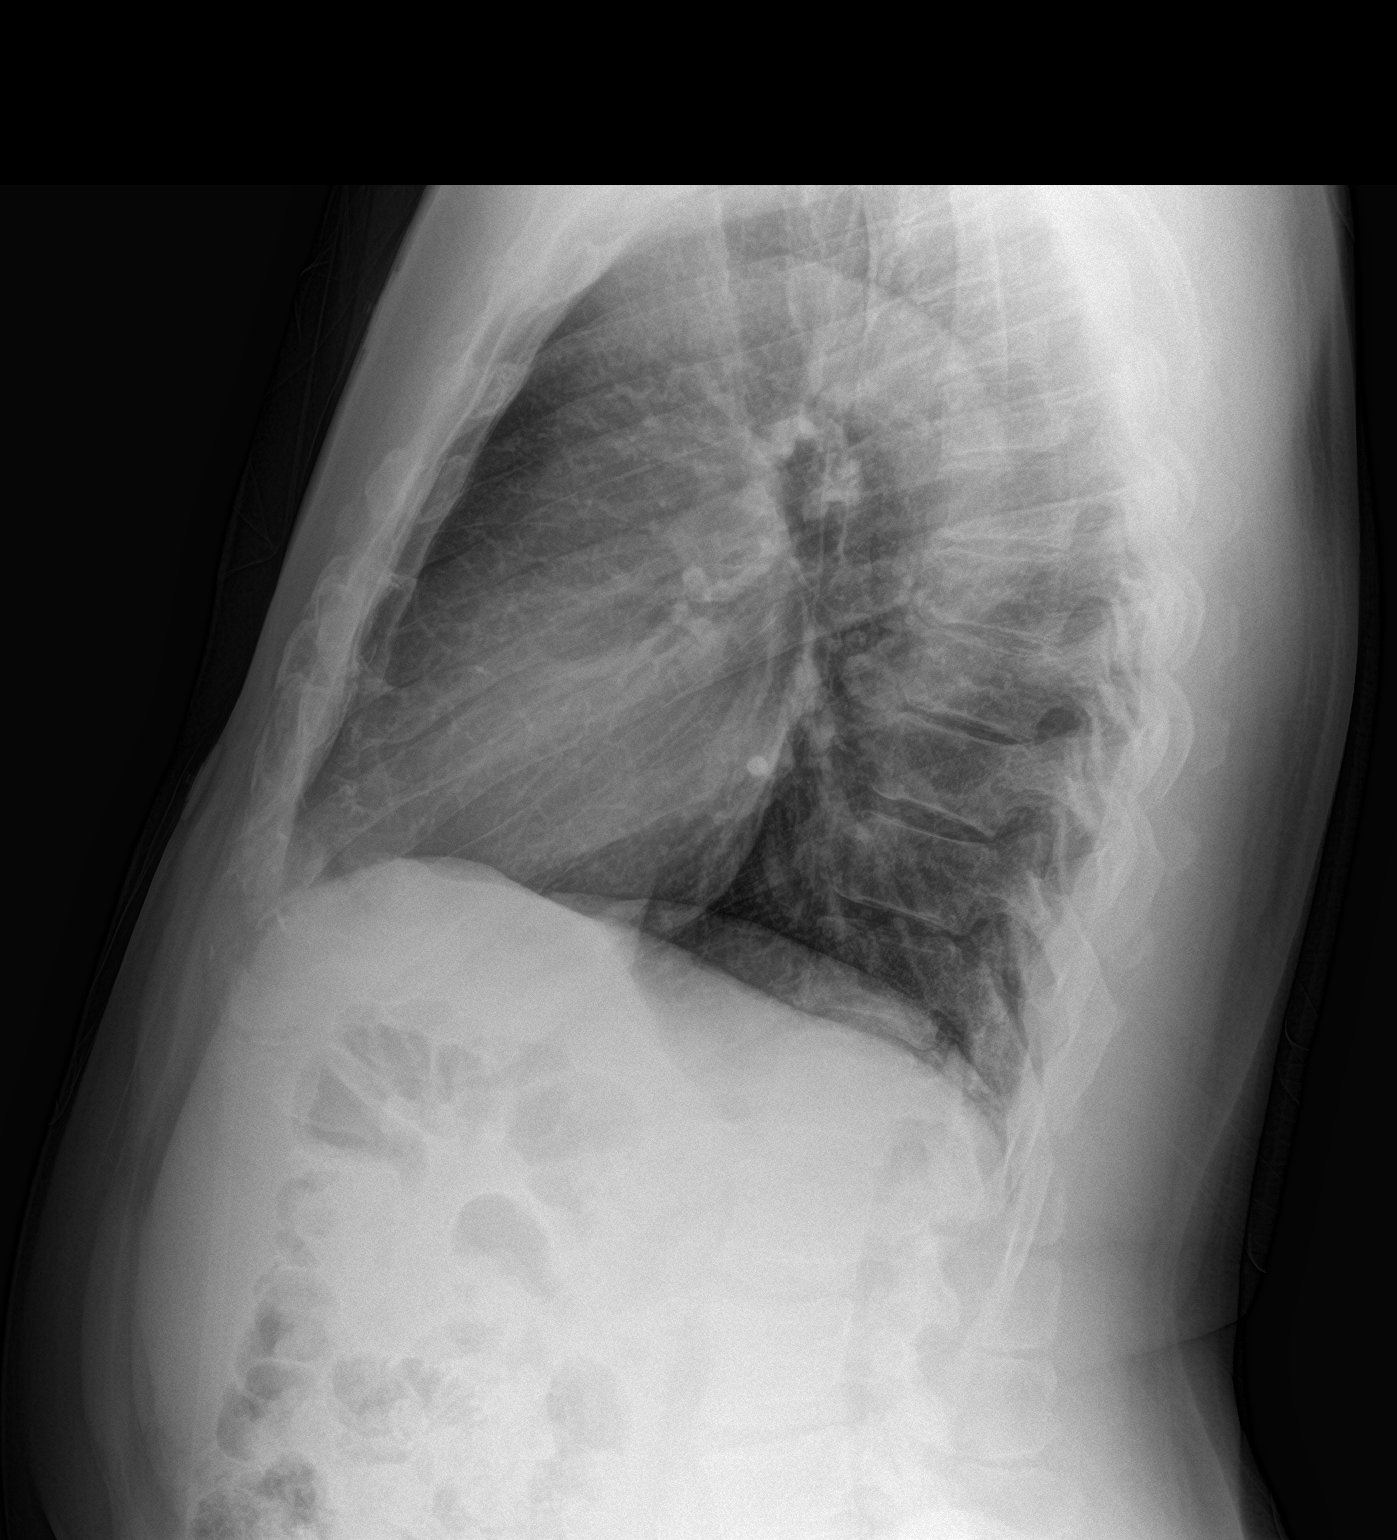

[2 of 2 positions shown; findings below may reference images not displayed]

FINDINGS: The heart size and mediastinal contours are within normal limits.
Both lungs are clear. The visualized skeletal structures are
unremarkable.
IMPRESSION: No active cardiopulmonary disease.

## 2021-08-04 ENCOUNTER — Encounter: Payer: Self-pay | Admitting: Adult Health

## 2021-08-04 ENCOUNTER — Ambulatory Visit (INDEPENDENT_AMBULATORY_CARE_PROVIDER_SITE_OTHER): Payer: No Typology Code available for payment source | Admitting: Adult Health

## 2021-08-04 ENCOUNTER — Ambulatory Visit (INDEPENDENT_AMBULATORY_CARE_PROVIDER_SITE_OTHER): Payer: No Typology Code available for payment source | Admitting: Student

## 2021-08-04 VITALS — BP 162/100 | HR 78 | Temp 97.8°F | Ht 69.0 in | Wt 178.6 lb

## 2021-08-04 DIAGNOSIS — R0600 Dyspnea, unspecified: Secondary | ICD-10-CM | POA: Diagnosis not present

## 2021-08-04 DIAGNOSIS — J4520 Mild intermittent asthma, uncomplicated: Secondary | ICD-10-CM

## 2021-08-04 DIAGNOSIS — R053 Chronic cough: Secondary | ICD-10-CM

## 2021-08-04 DIAGNOSIS — J45909 Unspecified asthma, uncomplicated: Secondary | ICD-10-CM | POA: Insufficient documentation

## 2021-08-04 DIAGNOSIS — R0683 Snoring: Secondary | ICD-10-CM | POA: Diagnosis not present

## 2021-08-04 LAB — PULMONARY FUNCTION TEST
DL/VA % pred: 99 %
DL/VA: 4.23 ml/min/mmHg/L
DLCO cor % pred: 77 %
DLCO cor: 20.68 ml/min/mmHg
DLCO unc % pred: 77 %
DLCO unc: 20.68 ml/min/mmHg
FEF 25-75 Post: 3.62 L/sec
FEF 25-75 Pre: 4.62 L/sec
FEF2575-%Change-Post: -21 %
FEF2575-%Pred-Post: 128 %
FEF2575-%Pred-Pre: 164 %
FEV1-%Change-Post: -6 %
FEV1-%Pred-Post: 105 %
FEV1-%Pred-Pre: 112 %
FEV1-Post: 3.17 L
FEV1-Pre: 3.38 L
FEV1FVC-%Change-Post: -5 %
FEV1FVC-%Pred-Pre: 110 %
FEV6-%Change-Post: -1 %
FEV6-%Pred-Post: 103 %
FEV6-%Pred-Pre: 104 %
FEV6-Post: 3.85 L
FEV6-Pre: 3.9 L
FEV6FVC-%Change-Post: 0 %
FEV6FVC-%Pred-Post: 103 %
FEV6FVC-%Pred-Pre: 103 %
FVC-%Change-Post: -1 %
FVC-%Pred-Post: 99 %
FVC-%Pred-Pre: 100 %
FVC-Post: 3.85 L
FVC-Pre: 3.9 L
Post FEV1/FVC ratio: 82 %
Post FEV6/FVC ratio: 100 %
Pre FEV1/FVC ratio: 87 %
Pre FEV6/FVC Ratio: 100 %
RV % pred: 95 %
RV: 2.09 L
TLC % pred: 85 %
TLC: 5.81 L

## 2021-08-04 LAB — POCT EXHALED NITRIC OXIDE: FeNO level (ppb): 23

## 2021-08-04 NOTE — Progress Notes (Signed)
? ?@Patient  ID: Corey Norton, male    DOB: Sep 16, 1960, 61 y.o.   MRN: [id number] ? ?Chief Complaint  ?Patient presents with  ? Follow-up  ? ? ?Referring provider: ?No ref. provider found ? ?HPI: ?61 year old male never smoker seen for pulmonary consult May 05, 2021 for chronic cough post COVID-19 infection in January 2021.  Patient with a history of asthma. ? ?TEST/EVENTS :  ? ?08/04/2021 Follow up : Chronic cough  ?Patient returns for 50-month follow-up.  Patient was seen last visit for a chronic cough that was been present since having COVID-19 infection in January 2021.  Patient was started on Xyzal and Flonase for possible postnasal drip trigger.  Chest x-ray was clear.  Patient was set up for PFTs that were completed today.  This showed normal lung function with FEV1 at 105%, ratio 82, FVC 99%, no significant bronchodilator response, DLCO slightly diminished at 77% ?Since last visit patient is feeling some better. Did not not take the Miranda for little while. Decreased albuterol use.  Overall patient does feel that he is better.  Does get some shortness of breath intermittently. ? ?Says he has sleep apnea and insomnia . Not on CPAP . Complains trouble sleeping , restless sleep , snoring. Has very bad snoring .  Patient says he had a sleep study done a few years ago but does not not have the results.  Patient wants a reevaluation.  Epworth score is ?Patient says he has very restless at night.  Has significant snoring that is disruptive to his sleep.  And does feel tired throughout the day. ? ? ?No Known Allergies ? ?Immunization History  ?Administered Date(s) Administered  ? PPD Test 12/08/2020  ? ? ?Past Medical History:  ?Diagnosis Date  ? BPH (benign prostatic hyperplasia)   ? Chronic prostatitis   ? COVID-19 04/2019  ? outpatient pneumonitis  ? Elevated PSA   ? Hyperlipidemia   ? ? ?Tobacco History: ?Social History  ? ?Tobacco Use  ?Smoking Status Never  ?Smokeless Tobacco Never  ? ?Counseling  given: Not Answered ? ? ?Outpatient Medications Prior to Visit  ?Medication Sig Dispense Refill  ? diphenhydrAMINE (BENADRYL) 25 mg capsule Take 25 mg by mouth every 6 (six) hours as needed.    ? fluticasone (FLONASE) 50 MCG/ACT nasal spray Place 1 spray into both nostrils daily. 16 g 2  ? OVER THE COUNTER MEDICATION Take 1 tablet by mouth as needed. Equate brand (Tylenol PM)    ? albuterol (VENTOLIN HFA) 108 (90 Base) MCG/ACT inhaler Inhale 2 puffs into the lungs every 6 (six) hours as needed for wheezing or shortness of breath. (Patient not taking: Reported on 08/04/2021)    ? ibuprofen (ADVIL) 200 MG tablet Take 200 mg by mouth every 6 (six) hours as needed. 2 tabs by mouth at bedtime (Patient not taking: Reported on 08/04/2021)    ? levocetirizine (XYZAL) 5 MG tablet Take 1 tablet (5 mg total) by mouth every evening. (Patient not taking: Reported on 08/04/2021) 30 tablet 11  ? ?No facility-administered medications prior to visit.  ? ? ? ?Review of Systems:  ? ?Constitutional:   No  weight loss, night sweats,  Fevers, chills,  ?+fatigue, or  lassitude. ? ?HEENT:   No headaches,  Difficulty swallowing,  Tooth/dental problems, or  Sore throat,  ?              No sneezing, itching, ear ache,  ?+nasal congestion, post nasal drip,  ? ?CV:  No chest pain,  Orthopnea, PND, swelling in lower extremities, anasarca, dizziness, palpitations, syncope.  ? ?GI  No heartburn, indigestion, abdominal pain, nausea, vomiting, diarrhea, change in bowel habits, loss of appetite, bloody stools.  ? ?Resp:  No excess mucus, no productive cough,  No non-productive cough,  No coughing up of blood.  No change in color of mucus.  No wheezing.  No chest wall deformity ? ?Skin: no rash or lesions. ? ?GU: no dysuria, change in color of urine, no urgency or frequency.  No flank pain, no hematuria  ? ?MS:  No joint pain or swelling.  No decreased range of motion.  No back pain. ? ? ? ?Physical Exam ? ?BP (!) 162/100 (BP Location: Left Arm, Patient  Position: Sitting, Cuff Size: Large)   Pulse 78   Temp 97.8 ?F (36.6 ?C) (Oral)   Ht 5\' 9"  (1.753 m)   Wt 178 lb 9.6 oz (81 kg)   SpO2 100%   BMI 26.37 kg/m?  ? ?GEN: A/Ox3; pleasant , NAD, well nourished  ?  ?HEENT:  [place]/AT,  EACs-clear, TMs-wnl, NOSE-clear, THROAT-clear, no lesions, no postnasal drip or exudate noted. Class 3 MP airway  ? ?NECK:  Supple w/ fair ROM; no JVD; normal carotid impulses w/o bruits; no thyromegaly or nodules palpated; no lymphadenopathy.   ? ?RESP  Clear  P & A; w/o, wheezes/ rales/ or rhonchi. no accessory muscle use, no dullness to percussion ? ?CARD:  RRR, no m/r/g, no peripheral edema, pulses intact, no cyanosis or clubbing. ? ?GI:   Soft & nt; nml bowel sounds; no organomegaly or masses detected.  ? ?Musco: Warm bil, no deformities or joint swelling noted.  ? ?Neuro: alert, no focal deficits noted.   ? ?Skin: Warm, no lesions or rashes ? ? ? ?Lab Results: ? ?CBC ?   ?Component Value Date/Time  ? WBC 8.1 12/14/2020 0740  ? RBC 5.66 12/14/2020 0740  ? HGB 14.8 12/14/2020 0740  ? HCT 46.0 12/14/2020 0740  ? PLT 277 12/14/2020 0740  ? MCV 81.3 12/14/2020 0740  ? MCH 26.1 12/14/2020 0740  ? MCHC 32.2 12/14/2020 0740  ? RDW 14.1 12/14/2020 0740  ? LYMPHSABS 3.2 12/14/2020 0740  ? MONOABS 0.8 12/14/2020 0740  ? EOSABS 0.3 12/14/2020 0740  ? BASOSABS 0.0 12/14/2020 0740  ? ? ?BMET ?   ?Component Value Date/Time  ? NA 136 12/14/2020 0740  ? K 3.6 12/14/2020 0740  ? CL 103 12/14/2020 0740  ? CO2 27 12/14/2020 0740  ? GLUCOSE 104 (H) 12/14/2020 0740  ? BUN 11 12/14/2020 0740  ? CREATININE 1.01 12/14/2020 0740  ? CALCIUM 9.3 12/14/2020 0740  ? GFRNONAA >60 12/14/2020 0740  ? GFRAA >60 11/28/2019 2148  ? ? ?BNP ?No results found for: BNP ? ?ProBNP ?No results found for: PROBNP ? ?Imaging: ?No results found. ? ? ? ? ?  Latest Ref Rng & Units 08/04/2021  ? 12:52 PM  ?PFT Results  ?FVC-Pre L 3.90  P  ?FVC-Predicted Pre % 100  P  ?FVC-Post L 3.85  P  ?FVC-Predicted Post % 99  P  ?Pre FEV1/FVC % %  87  P  ?Post FEV1/FCV % % 82  P  ?FEV1-Pre L 3.38  P  ?FEV1-Predicted Pre % 112  P  ?FEV1-Post L 3.17  P  ?DLCO uncorrected ml/min/mmHg 20.68  P  ?DLCO UNC% % 77  P  ?DLCO corrected ml/min/mmHg 20.68  P  ?DLCO COR %Predicted % 77  P  ?  DLVA Predicted % 99  P  ?TLC L 5.81  P  ?TLC % Predicted % 85  P  ?RV % Predicted % 95  P  ?  ?P Preliminary result  ? ? ?No results found for: NITRICOXIDE ? ? ? ? ? ?Assessment & Plan:  ? ?Chronic cough ?Chronic cough/postviral cough syndrome versus cough variant asthma-cough seems to be improved.  Continue trigger prevention.  Patient is advised if cough really starts to restart Xyzal and Flonase.  May also use Delsym for cough control. ? ?Plan  ?Patient Instructions  ?Xyzal 5mg  At bedtime  As needed  drainage  ?Delsym 2 tsp Twice daily  As needed  cough  ?Flonase nasal 1 puff daily As needed  drainage  ?Saline nasal rinse As needed   ?Saline nasal gel At bedtime   ?Ventolin inhaler As needed   ? ?Work on healthy weight  ?Do not drive if sleepy  ?Set up for home sleep study  ? ?Follow up with Dr. Verlee Monte or Aminat Shelburne NP in 3 months and As needed   ? ? ?  ? ? ?Mild asthma ?Mild intermittent asthma.  Exhaled nitric oxide testing today is normal at 23.  PFTs show normal lung function without airflow obstruction or restriction.  Diffusing capacity was mildly decreased. ?Patient has had no significant symptoms over the last 2 months.  May use Ventolin as needed.  Asthma action plan discussed.  Continue with trigger prevention ? ?Plan  ?Patient Instructions  ?Xyzal 5mg  At bedtime  As needed  drainage  ?Delsym 2 tsp Twice daily  As needed  cough  ?Flonase nasal 1 puff daily As needed  drainage  ?Saline nasal rinse As needed   ?Saline nasal gel At bedtime   ?Ventolin inhaler As needed   ? ?Work on healthy weight  ?Do not drive if sleepy  ?Set up for home sleep study  ? ?Follow up with Dr. Verlee Monte or Trell Secrist NP in 3 months and As needed   ? ? ?  ? ? ?Snoring ?Snoring, restless sleep, his previous  diagnosis of sleep apnea.  All suspicious for underlying ongoing sleep apnea.  We will check a home sleep study. ? ?Plan  ?Patient Instructions  ?Xyzal 5mg  At bedtime  As needed  drainage  ?Delsym 2 tsp Twice d

## 2021-08-04 NOTE — Assessment & Plan Note (Signed)
Snoring, restless sleep, his previous diagnosis of sleep apnea.  All suspicious for underlying ongoing sleep apnea.  We will check a home sleep study. ? ?Plan  ?Patient Instructions  ?Xyzal 5mg  At bedtime  As needed  drainage  ?Delsym 2 tsp Twice daily  As needed  cough  ?Flonase nasal 1 puff daily As needed  drainage  ?Saline nasal rinse As needed   ?Saline nasal gel At bedtime   ?Ventolin inhaler As needed   ? ?Work on healthy weight  ?Do not drive if sleepy  ?Set up for home sleep study  ? ?Follow up with Dr. or Nobel Brar NP in 3 months and As needed   ? ? ?  ? ?

## 2021-08-04 NOTE — Patient Instructions (Addendum)
Xyzal 5mg  At bedtime  As needed  drainage  ?Delsym 2 tsp Twice daily  As needed  cough  ?Flonase nasal 1 puff daily As needed  drainage  ?Saline nasal rinse As needed   ?Saline nasal gel At bedtime   ?Ventolin inhaler As needed   ? ?Work on healthy weight  ?Do not drive if sleepy  ?Set up for home sleep study  ? ?Follow up with Dr. or Josedaniel Haye NP in 3 months and As needed   ? ? ?

## 2021-08-04 NOTE — Assessment & Plan Note (Signed)
Mild intermittent asthma.  Exhaled nitric oxide testing today is normal at 23.  PFTs show normal lung function without airflow obstruction or restriction.  Diffusing capacity was mildly decreased. ?Patient has had no significant symptoms over the last 2 months.  May use Ventolin as needed.  Asthma action plan discussed.  Continue with trigger prevention ? ?Plan  ?Patient Instructions  ?Xyzal 5mg  At bedtime  As needed  drainage  ?Delsym 2 tsp Twice daily  As needed  cough  ?Flonase nasal 1 puff daily As needed  drainage  ?Saline nasal rinse As needed   ?Saline nasal gel At bedtime   ?Ventolin inhaler As needed   ? ?Work on healthy weight  ?Do not drive if sleepy  ?Set up for home sleep study  ? ?Follow up with Dr. or Gurvir Schrom NP in 3 months and As needed   ? ? ?  ? ?

## 2021-08-04 NOTE — Progress Notes (Signed)
PFT done today. 

## 2021-08-04 NOTE — Assessment & Plan Note (Signed)
Chronic cough/postviral cough syndrome versus cough variant asthma-cough seems to be improved.  Continue trigger prevention.  Patient is advised if cough really starts to restart Xyzal and Flonase.  May also use Delsym for cough control. ? ?Plan  ?Patient Instructions  ?Xyzal 5mg  At bedtime  As needed  drainage  ?Delsym 2 tsp Twice daily  As needed  cough  ?Flonase nasal 1 puff daily As needed  drainage  ?Saline nasal rinse As needed   ?Saline nasal gel At bedtime   ?Ventolin inhaler As needed   ? ?Work on healthy weight  ?Do not drive if sleepy  ?Set up for home sleep study  ? ?Follow up with Dr. or Barton Want NP in 3 months and As needed   ? ? ?  ? ?

## 2021-08-07 MED ORDER — TRELEGY ELLIPTA 100-62.5-25 MCG/ACT IN AEPB: 1.0000 | INHALATION_SPRAY | Freq: Every day | RESPIRATORY_TRACT | 0 refills | Status: DC

## 2021-08-07 NOTE — Addendum Note (Signed)
Addended by: Delrae Rend on: 08/07/2021 10:31 AM ? ? Modules accepted: Orders ? ?

## 2021-11-05 NOTE — Progress Notes (Deleted)
Synopsis: Referred for dyspnea by self  Subjective:   PATIENT ID: Corey Norton GENDER: male DOB: [birth year], MRN: [id number]  No chief complaint on file.  61yM no history of asthma, with history of covid-19 infection 04/2019. Was not hospitalized. Not vaccinated for covid-19  He says it took about 7 weeks to recover strength after covid-19 infection. But eventually developed chest tightness, wheeze. Which has since been recurrent, intermittent issue for him. He doesn't think prednisone was helpful for one of these episodes in January of this year. Albuterol minimally helpful. Has had intermittent issues with sinonasal drainage - using antihistamine. Gets reflux maybe 1-2x per week, has tried working on lifestyle measures.  He has not had PFTs.   Otherwise pertinent review of systems is negative.  No family history of lung disease  Never smoked cigarettes, did smoke MJ as a teenager long ago. No vaping. 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   Interval HPI: Had been doing well on flonase at last visit with [facility]. PFTs with only mildly reduced diffusing capacity. Home sleep study ordered.   Past Medical History:  Diagnosis Date   BPH (benign prostatic hyperplasia)    Chronic prostatitis    COVID-19 04/2019   outpatient pneumonitis   Elevated PSA    Hyperlipidemia      Family History  Problem Relation Age of Onset   Cancer Father        Prostate--cause of death   Prostatitis Brother        vs BPH     Past Surgical History:  Procedure Laterality Date   KNEE SURGERY      Social History   Socioeconomic History   Marital status: Single    Spouse name: Not on file   Number of children: Not on file   Years of education: Not on file   Highest education level: Not on file  Occupational History   Not on file  Tobacco Use   Smoking status: Never   Smokeless tobacco: Never  Vaping Use   Vaping Use: Not on file  Substance and Sexual Activity   Alcohol use: Yes   Drug use:  Never   Sexual activity: Not on file  Other Topics Concern   Not on file  Social History Narrative   Not on file   Social Determinants of Health   Financial Resource Strain: Not on file  Food Insecurity: Not on file  Transportation Needs: Not on file  Physical Activity: Not on file  Stress: Not on file  Social Connections: Not on file  Intimate Partner Violence: Not on file     No Known Allergies   Outpatient Medications Prior to Visit  Medication Sig Dispense Refill   albuterol (VENTOLIN HFA) 108 (90 Base) MCG/ACT inhaler Inhale 2 puffs into the lungs every 6 (six) hours as needed for wheezing or shortness of breath. (Patient not taking: Reported on 08/04/2021)     diphenhydrAMINE (BENADRYL) 25 mg capsule Take 25 mg by mouth every 6 (six) hours as needed.     fluticasone (FLONASE) 50 MCG/ACT nasal spray Place 1 spray into both nostrils daily. 16 g 2   Fluticasone-Umeclidin-Vilant (TRELEGY ELLIPTA) 100-62.5-25 MCG/ACT AEPB Inhale 1 puff into the lungs daily. 1 each 0   ibuprofen (ADVIL) 200 MG tablet Take 200 mg by mouth every 6 (six) hours as needed. 2 tabs by mouth at bedtime (Patient not taking: Reported on 08/04/2021)     levocetirizine (XYZAL) 5 MG tablet Take 1 tablet (5 mg  total) by mouth every evening. (Patient not taking: Reported on 08/04/2021) 30 tablet 11   OVER THE COUNTER MEDICATION Take 1 tablet by mouth as needed. Equate brand (Tylenol PM)     No facility-administered medications prior to visit.       Objective:   Physical Exam:  General appearance: 61 y.o., male, NAD, conversant  Eyes: anicteric sclerae; PERRL, tracking appropriately HENT: NCAT; MMM Neck: Trachea midline; no lymphadenopathy, no JVD Lungs: CTAB, no crackles, no wheeze, with normal respiratory effort CV: RRR, no murmur  Abdomen: Soft, non-tender; non-distended, BS present  Extremities: No peripheral edema, warm Skin: Normal turgor and texture; no rash Psych: Appropriate affect Neuro: Alert  and oriented to person and place, no focal deficit     There were no vitals filed for this visit.    on RA BMI Readings from Last 3 Encounters:  08/04/21 26.37 kg/m  05/05/21 28.43 kg/m  12/13/20 28.65 kg/m   Wt Readings from Last 3 Encounters:  08/04/21 178 lb 9.6 oz (81 kg)  05/05/21 187 lb (84.8 kg)  12/13/20 194 lb (88 kg)     CBC    Component Value Date/Time   WBC 8.1 12/14/2020 0740   RBC 5.66 12/14/2020 0740   HGB 14.8 12/14/2020 0740   HCT 46.0 12/14/2020 0740   PLT 277 12/14/2020 0740   MCV 81.3 12/14/2020 0740   MCH 26.1 12/14/2020 0740   MCHC 32.2 12/14/2020 0740   RDW 14.1 12/14/2020 0740   LYMPHSABS 3.2 12/14/2020 0740   MONOABS 0.8 12/14/2020 0740   EOSABS 0.3 12/14/2020 0740   BASOSABS 0.0 12/14/2020 0740    Chest Imaging: CXR 05/05/21 reviewed by me. I question whether there are diffuse subtle ggo/alveolar opacity  CXR 12/13/20 reviewed by me unremarkable save for hypoinflation  CTA Chest 06/09/19 from [facility] report reviewed by me: Lungs: Minimal basilar dependent changes.  No consolidation.  Pleural spaces: No pleural effusion. Pleural based 2 foci along the fissure  series 7, image 40 likely and bilaterally typically are benign.. No follow-up  suggested. No pneumonia.  Heart:  No pericardial effusion.  Lymph nodes:  No enlarged lymph nodes.    Pulmonary Functions Testing Results:    Latest Ref Rng & Units 08/04/2021   12:52 PM  PFT Results  FVC-Pre L 3.90   FVC-Predicted Pre % 100   FVC-Post L 3.85   FVC-Predicted Post % 99   Pre FEV1/FVC % % 87   Post FEV1/FCV % % 82   FEV1-Pre L 3.38   FEV1-Predicted Pre % 112   FEV1-Post L 3.17   DLCO uncorrected ml/min/mmHg 20.68   DLCO UNC% % 77   DLCO corrected ml/min/mmHg 20.68   DLCO COR %Predicted % 77   DLVA Predicted % 99   TLC L 5.81   TLC % Predicted % 85   RV % Predicted % 95    PFT 08/04/21 reviewed by me with very mildly reduced diffusing capacity, otherwise unremarkable      Assessment & Plan:   # Episodic dyspnea, cough, wheeze; History suggestive of asthma or covid-related reactive airways/small airways disease. Control may be worse in setting recurrent post-nasal drainage/rhinitis. I do wonder if his CXR today however has widespread faint ggo/alveolar opacification - awaiting final read.  Plan: - cbc/diff, IgE, BNP - PFT, hold albuterol for 2 days before PFT - CXR today reviewed as above - awaiting final radiology read. If suspicious for widespread subtle ggo, will go ahead and order CT Chest, if  not then await PFTs and if DLCO low then would order HRCT Chest. - prn albuterol - flonase 1 spray each nare after clearing nose of crusting following shower - xyzal 5 mg daily   RTC 2-4 weeks with PFT      Omar Person, MD [facility] 11/05/2021 6:53 PM

## 2021-11-07 ENCOUNTER — Ambulatory Visit: Payer: No Typology Code available for payment source | Admitting: Student

## 2021-11-15 ENCOUNTER — Encounter: Payer: Self-pay | Admitting: Adult Health

## 2021-12-15 IMAGING — DX DG CHEST 1V PORT
1 series · 1 of 1 positions shown · non-contrast
Comparison: 04/09/2020

CLINICAL DATA: Worsening congestion

EXAM:
PORTABLE CHEST 1 VIEW

[chest ap]
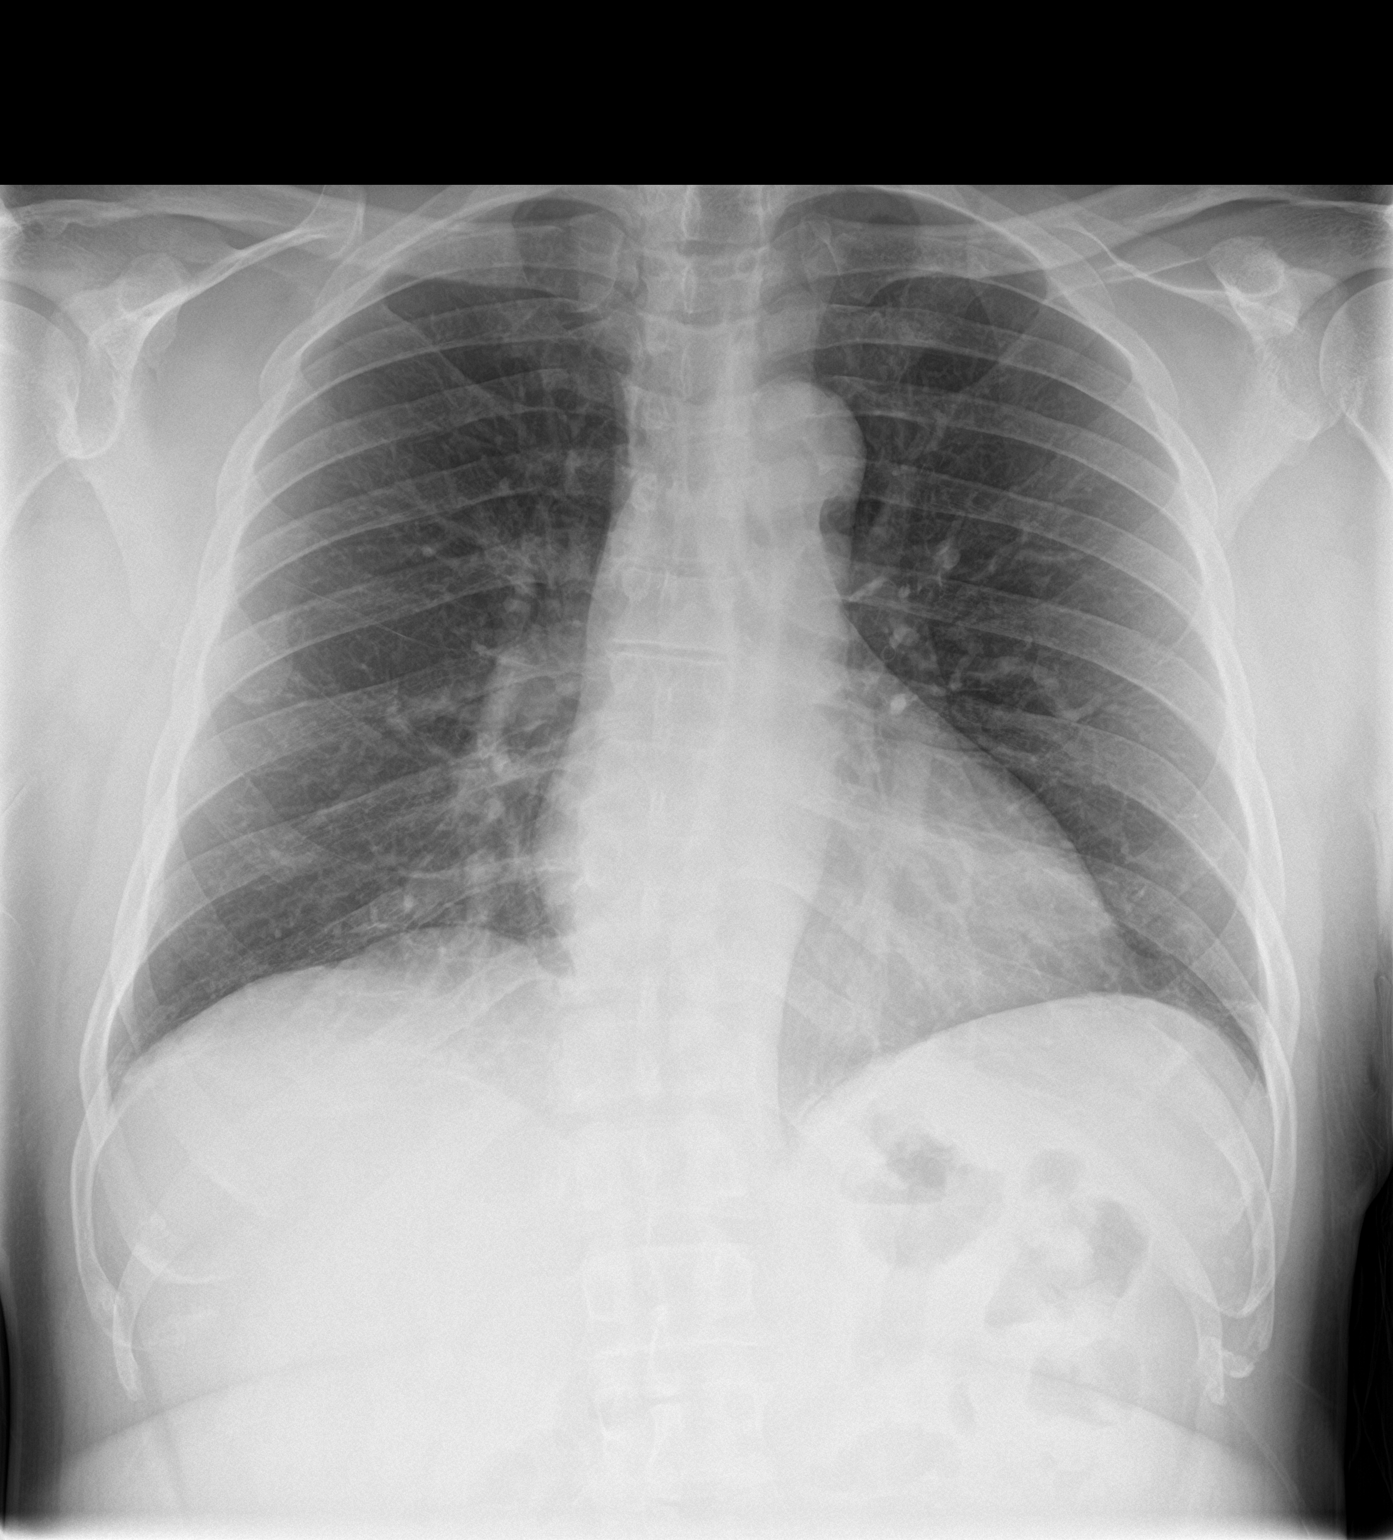

[1 of 1 positions shown; findings below may reference images not displayed]

FINDINGS: Heart and mediastinal contours are within normal limits. No focal
opacities or effusions. No acute bony abnormality.
IMPRESSION: No active disease.

## 2021-12-19 ENCOUNTER — Ambulatory Visit: Payer: No Typology Code available for payment source

## 2021-12-19 DIAGNOSIS — R0683 Snoring: Secondary | ICD-10-CM

## 2022-01-18 ENCOUNTER — Encounter: Payer: Self-pay | Admitting: Adult Health

## 2022-10-23 ENCOUNTER — Ambulatory Visit: Payer: No Typology Code available for payment source

## 2022-10-23 ENCOUNTER — Ambulatory Visit: Payer: No Typology Code available for payment source | Admitting: Pulmonary Disease

## 2022-10-23 ENCOUNTER — Encounter: Payer: Self-pay | Admitting: Pulmonary Disease

## 2022-10-23 VITALS — BP 138/90 | HR 74 | Ht 69.0 in | Wt 184.2 lb

## 2022-10-23 DIAGNOSIS — R058 Other specified cough: Secondary | ICD-10-CM | POA: Diagnosis not present

## 2022-10-23 DIAGNOSIS — J4520 Mild intermittent asthma, uncomplicated: Secondary | ICD-10-CM | POA: Diagnosis not present

## 2022-10-23 LAB — CBC WITH DIFFERENTIAL/PLATELET
Basophils Absolute: 0.1 10*3/uL (ref 0.0–0.1)
Basophils Relative: 1.3 % (ref 0.0–3.0)
Eosinophils Absolute: 0.2 10*3/uL (ref 0.0–0.7)
Eosinophils Relative: 3.3 % (ref 0.0–5.0)
HCT: 45.4 % (ref 39.0–52.0)
Hemoglobin: 14.7 g/dL (ref 13.0–17.0)
Lymphocytes Relative: 34.6 % (ref 12.0–46.0)
Lymphs Abs: 2.2 10*3/uL (ref 0.7–4.0)
MCHC: 32.3 g/dL (ref 30.0–36.0)
MCV: 80.2 fl (ref 78.0–100.0)
Monocytes Absolute: 0.5 10*3/uL (ref 0.1–1.0)
Monocytes Relative: 7.3 % (ref 3.0–12.0)
Neutro Abs: 3.5 10*3/uL (ref 1.4–7.7)
Neutrophils Relative %: 53.5 % (ref 43.0–77.0)
Platelets: 310 10*3/uL (ref 150.0–400.0)
RBC: 5.66 Mil/uL (ref 4.22–5.81)
RDW: 14.6 % (ref 11.5–15.5)
WBC: 6.4 10*3/uL (ref 4.0–10.5)

## 2022-10-23 MED ORDER — FLUTICASONE-SALMETEROL 115-21 MCG/ACT IN AERO: 2.0000 | INHALATION_SPRAY | Freq: Two times a day (BID) | RESPIRATORY_TRACT | 12 refills | Status: DC

## 2022-10-23 NOTE — Patient Instructions (Addendum)
Recommend going to [facility] on [facility] number: [phone]  Start advair inhaler 2 puffs twice daily - rinse mouth out after each use  Continue Xyzal 5 mg daily for allergies  We will check a chest x-ray today  We will check labs today to check a CBC and IgE level  Follow up in 3 months

## 2022-10-23 NOTE — Progress Notes (Signed)
Synopsis: Referred in July 2024 for acute visit -   Subjective:   PATIENT ID: Corey Norton GENDER: male DOB: Aug 03, 1960, MRN: [id number]   HPI  Chief Complaint  Patient presents with   Acute Visit    Increased wheezing, non-productive and SOB for the past week. Also has noticed an increase in chest tightness.    Corey Norton is a 62 year old male, never smoker with history of asthma who is followed in clinic for chronic cough.  He returns today for acute visit. Last OV 08/04/21 by Rubye Oaks, NP. He returns today for cough over the past week, chest tightness and wheezing.  He denies any nighttime awakenings due to the cough.  Albuterol does help a bit with the cough.  He is taking Xyzal and reports seasonal allergy symptoms of watery eyes and sneezing.  He works at [employer] as a Investment banker, operational.  He does not smoke or vape.  Cigarette smoke tends to bother his breathing.  He denies issues with strong perfumes or colognes.  Humid days can bother his cough/breathing.  He got a new air conditioner about a week ago.  Past Medical History:  Diagnosis Date   BPH (benign prostatic hyperplasia)    Chronic prostatitis    COVID-19 04/2019   outpatient pneumonitis   Elevated PSA    Hyperlipidemia      Family History  Problem Relation Age of Onset   Cancer Father        Prostate--cause of death   Prostatitis Brother        vs BPH     Social History   Socioeconomic History   Marital status: Single    Spouse name: Not on file   Number of children: Not on file   Years of education: Not on file   Highest education level: Not on file  Occupational History   Not on file  Tobacco Use   Smoking status: Never   Smokeless tobacco: Never  Vaping Use   Vaping status: Not on file  Substance and Sexual Activity   Alcohol use: Yes   Drug use: Never   Sexual activity: Not on file  Other Topics Concern   Not on file  Social History Narrative   Not on file   Social  Determinants of Health   Financial Resource Strain: Not on file  Food Insecurity: Not on file  Transportation Needs: Not on file  Physical Activity: Not on file  Stress: Not on file  Social Connections: Not on file  Intimate Partner Violence: Not on file     No Known Allergies   Outpatient Medications Prior to Visit  Medication Sig Dispense Refill   albuterol (VENTOLIN HFA) 108 (90 Base) MCG/ACT inhaler Inhale 2 puffs into the lungs every 6 (six) hours as needed for wheezing or shortness of breath.     diphenhydrAMINE (BENADRYL) 25 mg capsule Take 25 mg by mouth every 6 (six) hours as needed.     fluticasone (FLONASE) 50 MCG/ACT nasal spray Place 1 spray into both nostrils daily. 16 g 2   Fluticasone-Umeclidin-Vilant (TRELEGY ELLIPTA) 100-62.5-25 MCG/ACT AEPB Inhale 1 puff into the lungs daily. 1 each 0   ibuprofen (ADVIL) 200 MG tablet Take 200 mg by mouth every 6 (six) hours as needed. 2 tabs by mouth at bedtime (Patient not taking: Reported on 08/04/2021)     levocetirizine (XYZAL) 5 MG tablet Take 1 tablet (5 mg total) by mouth every evening. (Patient not taking: Reported on  08/04/2021) 30 tablet 11   OVER THE COUNTER MEDICATION Take 1 tablet by mouth as needed. Equate brand (Tylenol PM)     No facility-administered medications prior to visit.    Review of Systems  Constitutional:  Negative for chills, fever, malaise/fatigue and weight loss.  HENT:  Negative for congestion, sinus pain and sore throat.   Eyes: Negative.   Respiratory:  Positive for cough, shortness of breath and wheezing. Negative for hemoptysis and sputum production.   Cardiovascular:  Negative for chest pain, palpitations, orthopnea, claudication and leg swelling.  Gastrointestinal:  Negative for abdominal pain, heartburn, nausea and vomiting.  Genitourinary: Negative.   Musculoskeletal:  Negative for joint pain and myalgias.  Skin:  Negative for rash.  Neurological:  Negative for weakness.  Endo/Heme/Allergies:  Negative.   Psychiatric/Behavioral: Negative.      Objective:   Vitals:   10/23/22 1048  BP: (!) 138/90  Pulse: 74  SpO2: 98%  Weight: 184 lb 3.2 oz (83.6 kg)  Height: 5\' 9"  (1.753 m)     Physical Exam Constitutional:      General: He is not in acute distress.    Appearance: Normal appearance.  Eyes:     General: No scleral icterus.    Conjunctiva/sclera: Conjunctivae normal.  Cardiovascular:     Rate and Rhythm: Normal rate and regular rhythm.  Pulmonary:     Breath sounds: No wheezing, rhonchi or rales.  Musculoskeletal:     Right lower leg: No edema.     Left lower leg: No edema.  Skin:    General: Skin is warm and dry.  Neurological:     General: No focal deficit present.       CBC    Component Value Date/Time   WBC 6.4 10/23/2022 1148   RBC 5.66 10/23/2022 1148   HGB 14.7 10/23/2022 1148   HCT 45.4 10/23/2022 1148   PLT 310.0 10/23/2022 1148   MCV 80.2 10/23/2022 1148   MCH 26.1 12/14/2020 0740   MCHC 32.3 10/23/2022 1148   RDW 14.6 10/23/2022 1148   LYMPHSABS 2.2 10/23/2022 1148   MONOABS 0.5 10/23/2022 1148   EOSABS 0.2 10/23/2022 1148   BASOSABS 0.1 10/23/2022 1148     Chest imaging: CXR 10/23/22 Cardiac silhouette is normal in size. No mediastinal or hilar masses. No evidence of adenopathy.  PFT:    Latest Ref Rng & Units 08/04/2021   12:52 PM  PFT Results  FVC-Pre L 3.90   FVC-Predicted Pre % 100   FVC-Post L 3.85   FVC-Predicted Post % 99   Pre FEV1/FVC % % 87   Post FEV1/FCV % % 82   FEV1-Pre L 3.38   FEV1-Predicted Pre % 112   FEV1-Post L 3.17   DLCO uncorrected ml/min/mmHg 20.68   DLCO UNC% % 77   DLCO corrected ml/min/mmHg 20.68   DLCO COR %Predicted % 77   DLVA Predicted % 99   TLC L 5.81   TLC % Predicted % 85   RV % Predicted % 95     Labs:  Path:  Echo:  Heart Catheterization:       Assessment & Plan:   Recurrent cough - Plan: DG Chest 2 View  Mild intermittent reactive airway disease without  complication - Plan: IgE, CBC with Differential/Platelet, fluticasone-salmeterol (ADVAIR HFA) 115-21 MCG/ACT inhaler, CBC with Differential/Platelet, IgE  Discussion: Corey Norton is a 62 year old male, never smoker with history of asthma who is followed in clinic for chronic cough.  He has increased symptoms of chest tightness, cough and wheezing over the past 1 week.  He appears to have mild intermittent reactive airways disease.  He is to start Advair HFA 115-21 mcg 2 puffs twice daily.  We will check CBC with differential and IgE levels today.  He is to continue Xyzal 5 mg daily.  Chest x-ray today is unremarkable.  Follow-up in 3 months.  Melody Comas, MD [facility] Office: [phone]   Current Outpatient Medications:    albuterol (VENTOLIN HFA) 108 (90 Base) MCG/ACT inhaler, Inhale 2 puffs into the lungs every 6 (six) hours as needed for wheezing or shortness of breath., Disp: , Rfl:    diphenhydrAMINE (BENADRYL) 25 mg capsule, Take 25 mg by mouth every 6 (six) hours as needed., Disp: , Rfl:    fluticasone (FLONASE) 50 MCG/ACT nasal spray, Place 1 spray into both nostrils daily., Disp: 16 g, Rfl: 2   fluticasone-salmeterol (ADVAIR HFA) 115-21 MCG/ACT inhaler, Inhale 2 puffs into the lungs 2 (two) times daily., Disp: 1 each, Rfl: 12

## 2022-10-25 LAB — IGE: IgE (Immunoglobulin E), Serum: 32 kU/L (ref <=114)

## 2022-10-31 ENCOUNTER — Other Ambulatory Visit (HOSPITAL_COMMUNITY): Payer: Self-pay

## 2022-10-31 ENCOUNTER — Telehealth: Payer: Self-pay

## 2022-10-31 DIAGNOSIS — J4520 Mild intermittent asthma, uncomplicated: Secondary | ICD-10-CM

## 2022-10-31 NOTE — Telephone Encounter (Signed)
*  Pulm  Pharmacy Patient Advocate Encounter   Received notification from CoverMyMeds that prior authorization for Advair HFA 115-21MCG/ACT aerosol  is required/requested.   Insurance verification completed.   The patient is insured through [facility] .   Per test claim: PA required; PA submitted to [facility] via CoverMyMeds Key/confirmation #/EOC [id number] Status is pending

## 2022-11-03 ENCOUNTER — Encounter: Payer: Self-pay | Admitting: Pulmonary Disease

## 2022-11-06 NOTE — Telephone Encounter (Signed)
Pharmacy Patient Advocate Encounter  Received notification from [facility] that Prior Authorization for Advair HFA 115-21MCG/ACT aerosol  has been DENIED. Please advise how you'd like to proceed. Full denial letter will be uploaded to the media tab. See denial reason below.  PA #/Case ID/Reference #: [id number]  Your plan only covers this drug when you meet one of these options: A) You have tried other drugs your plan covers (preferred drugs), and they did not work well for you, or B) Your doctor gives [place] a medical reason you cannot take those other drugs. For your plan, you may need to try up to three preferred drugs. We have denied your request because you do not meet any of these conditions. We reviewed the information we had. Your request has been denied. Your doctor can send [place] any new or missing information for [place] to review. The preferred drugs for your plan are: fluticasone-salmeterol ([id number], [id number]), Wixela Inhub, BREO ELLIPTA (except certain NDCs). Your doctor may need to get approval from your plan for preferred drugs. For this drug, you may have to meet other criteria. You can request the drug policy for more details. You can also request other plan documents for your review.

## 2022-11-08 MED ORDER — FLUTICASONE-SALMETEROL 250-50 MCG/ACT IN AEPB: 1.0000 | INHALATION_SPRAY | Freq: Two times a day (BID) | RESPIRATORY_TRACT | 2 refills | Status: DC

## 2022-11-08 NOTE — Telephone Encounter (Signed)
Fluticasone-salmeterol diskus 250-50 1 puff twice daily sent to pharmacy.

## 2022-11-08 NOTE — Addendum Note (Signed)
Addended by: Melody Comas on: 11/08/2022 09:05 PM   Modules accepted: Orders

## 2023-11-22 ENCOUNTER — Ambulatory Visit (INDEPENDENT_AMBULATORY_CARE_PROVIDER_SITE_OTHER): Admitting: Pulmonary Disease

## 2023-11-22 ENCOUNTER — Encounter: Payer: Self-pay | Admitting: Pulmonary Disease

## 2023-11-22 VITALS — BP 153/99 | HR 78 | Temp 97.9°F | Ht 69.0 in | Wt 185.2 lb

## 2023-11-22 DIAGNOSIS — J4520 Mild intermittent asthma, uncomplicated: Secondary | ICD-10-CM

## 2023-11-22 MED ORDER — ALBUTEROL SULFATE HFA 108 (90 BASE) MCG/ACT IN AERS: 2.0000 | INHALATION_SPRAY | Freq: Four times a day (QID) | RESPIRATORY_TRACT | 5 refills | Status: AC | PRN

## 2023-11-22 MED ORDER — ALBUTEROL SULFATE (2.5 MG/3ML) 0.083% IN NEBU: 2.5000 mg | INHALATION_SOLUTION | Freq: Four times a day (QID) | RESPIRATORY_TRACT | 12 refills | Status: AC | PRN

## 2023-11-22 MED ORDER — BUDESONIDE-FORMOTEROL FUMARATE 80-4.5 MCG/ACT IN AERO: 2.0000 | INHALATION_SPRAY | Freq: Two times a day (BID) | RESPIRATORY_TRACT | 12 refills | Status: AC

## 2023-11-22 NOTE — Progress Notes (Signed)
 Synopsis: Referred in July 2024 for acute visit -   Subjective:   PATIENT ID: Corey Norton GENDER: male DOB: 1960-04-18, MRN: [id number]   HPI  Chief Complaint  Patient presents with   Follow-up    Pt states cough comes and goes  Cough has gotten better since LOV, but still has flare ups.  Pt is not coughing up anything. Worse around cigarette smoke.   Corey Norton is a 63 year old male, never smoker with history of asthma who returns to pulmonary clinic for follow up.  OV 10/23/22 He returns today for acute visit. Last OV 08/04/21 by Madelin Stank, NP. He returns today for cough over the past week, chest tightness and wheezing.  He denies any nighttime awakenings due to the cough.  Albuterol  does help a bit with the cough.  He is taking Xyzal  and reports seasonal allergy symptoms of watery eyes and sneezing.  He works at [employer] as a Investment banker, operational.  He does not smoke or vape.  Cigarette smoke tends to bother his breathing.  He denies issues with strong perfumes or colognes.  Humid days can bother his cough/breathing.  He got a new air conditioner about a week ago.  OV 11/22/23 He continues to have intermittent cough and chest tightness. He never started advair inhaler after last visit. Discussed need for maintenance inhaler today. He reports possible asbestos in his apartment and plans to move to a new apartment.  Past Medical History:  Diagnosis Date   BPH (benign prostatic hyperplasia)    Chronic prostatitis    COVID-19 04/2019   outpatient pneumonitis   Elevated PSA    Hyperlipidemia      Family History  Problem Relation Age of Onset   Cancer Father        Prostate--cause of death   Prostatitis Brother        vs BPH     Social History   Socioeconomic History   Marital status: Single    Spouse name: Not on file   Number of children: Not on file   Years of education: Not on file   Highest education level: Not on file  Occupational History   Not on file   Tobacco Use   Smoking status: Never   Smokeless tobacco: Never  Vaping Use   Vaping status: Not on file  Substance and Sexual Activity   Alcohol use: Yes   Drug use: Never   Sexual activity: Not on file  Other Topics Concern   Not on file  Social History Narrative   Not on file   Social Drivers of Health   Financial Resource Strain: Not on file  Food Insecurity: Not on file  Transportation Needs: Not on file  Physical Activity: Not on file  Stress: Not on file  Social Connections: Not on file  Intimate Partner Violence: Not on file     No Known Allergies   Outpatient Medications Prior to Visit  Medication Sig Dispense Refill   diphenhydrAMINE (BENADRYL) 25 mg capsule Take 25 mg by mouth every 6 (six) hours as needed. (Patient not taking: Reported on 11/22/2023)     fluticasone  (FLONASE ) 50 MCG/ACT nasal spray Place 1 spray into both nostrils daily. (Patient not taking: Reported on 11/22/2023) 16 g 2   albuterol  (VENTOLIN  HFA) 108 (90 Base) MCG/ACT inhaler Inhale 2 puffs into the lungs every 6 (six) hours as needed for wheezing or shortness of breath. (Patient not taking: Reported on 11/22/2023)  fluticasone -salmeterol (ADVAIR) 250-50 MCG/ACT AEPB Inhale 1 puff into the lungs in the morning and at bedtime. (Patient not taking: Reported on 11/22/2023) 60 each 2   No facility-administered medications prior to visit.   Review of Systems  Constitutional:  Negative for chills, fever, malaise/fatigue and weight loss.  HENT:  Negative for congestion, sinus pain and sore throat.   Eyes: Negative.   Respiratory:  Positive for cough, shortness of breath and wheezing. Negative for hemoptysis and sputum production.   Cardiovascular:  Negative for chest pain, palpitations, orthopnea, claudication and leg swelling.  Gastrointestinal:  Negative for abdominal pain, heartburn, nausea and vomiting.  Genitourinary: Negative.   Musculoskeletal:  Negative for joint pain and myalgias.  Skin:   Negative for rash.  Neurological:  Negative for weakness.  Endo/Heme/Allergies: Negative.   Psychiatric/Behavioral: Negative.     Objective:   Vitals:   11/22/23 1005  BP: (!) 153/99  Pulse: 78  Temp: 97.9 F (36.6 C)  SpO2: 99%  Weight: 185 lb 3.2 oz (84 kg)  Height: 5' 9 (1.753 m)   Physical Exam Constitutional:      General: He is not in acute distress.    Appearance: Normal appearance.  Eyes:     General: No scleral icterus.    Conjunctiva/sclera: Conjunctivae normal.  Cardiovascular:     Rate and Rhythm: Normal rate and regular rhythm.  Pulmonary:     Breath sounds: No wheezing, rhonchi or rales.  Musculoskeletal:     Right lower leg: No edema.     Left lower leg: No edema.  Skin:    General: Skin is warm and dry.  Neurological:     General: No focal deficit present.    CBC    Component Value Date/Time   WBC 6.4 10/23/2022 1148   RBC 5.66 10/23/2022 1148   HGB 14.7 10/23/2022 1148   HCT 45.4 10/23/2022 1148   PLT 310.0 10/23/2022 1148   MCV 80.2 10/23/2022 1148   MCH 26.1 12/14/2020 0740   MCHC 32.3 10/23/2022 1148   RDW 14.6 10/23/2022 1148   LYMPHSABS 2.2 10/23/2022 1148   MONOABS 0.5 10/23/2022 1148   EOSABS 0.2 10/23/2022 1148   BASOSABS 0.1 10/23/2022 1148     Chest imaging: CXR 10/23/22 Cardiac silhouette is normal in size. No mediastinal or hilar masses. No evidence of adenopathy.  PFT:    Latest Ref Rng & Units 08/04/2021   12:52 PM  PFT Results  FVC-Pre L 3.90   FVC-Predicted Pre % 100   FVC-Post L 3.85   FVC-Predicted Post % 99   Pre FEV1/FVC % % 87   Post FEV1/FCV % % 82   FEV1-Pre L 3.38   FEV1-Predicted Pre % 112   FEV1-Post L 3.17   DLCO uncorrected ml/min/mmHg 20.68   DLCO UNC% % 77   DLCO corrected ml/min/mmHg 20.68   DLCO COR %Predicted % 77   DLVA Predicted % 99   TLC L 5.81   TLC % Predicted % 85   RV % Predicted % 95     Labs:  Path:  Echo:  Heart Catheterization:       Assessment & Plan:   Mild  intermittent asthma without complication - Plan: budesonide -formoterol  (SYMBICORT ) 80-4.5 MCG/ACT inhaler, albuterol  (VENTOLIN  HFA) 108 (90 Base) MCG/ACT inhaler, albuterol  (PROVENTIL ) (2.5 MG/3ML) 0.083% nebulizer solution  Discussion: [name] is a 63 year old male, never smoker with history of asthma who is followed in clinic for chronic cough.  Mild intermittent asthma -  start symbicort  80-4.5mcg two puffs twice daily - use albuterol  inhaler 1-2 puffs every 4-6 hours or nebulizer treatment - Continue xyzal   Follow up in 1 year  Dorn Chill, MD [facility] Office: [phone]   Current Outpatient Medications:    albuterol  (PROVENTIL ) (2.5 MG/3ML) 0.083% nebulizer solution, Take 3 mLs (2.5 mg total) by nebulization every 6 (six) hours as needed for wheezing or shortness of breath., Disp: 75 mL, Rfl: 12   budesonide -formoterol  (SYMBICORT ) 80-4.5 MCG/ACT inhaler, Inhale 2 puffs into the lungs 2 (two) times daily., Disp: 1 each, Rfl: 12   albuterol  (VENTOLIN  HFA) 108 (90 Base) MCG/ACT inhaler, Inhale 2 puffs into the lungs every 6 (six) hours as needed for wheezing or shortness of breath., Disp: 8 g, Rfl: 5   diphenhydrAMINE (BENADRYL) 25 mg capsule, Take 25 mg by mouth every 6 (six) hours as needed. (Patient not taking: Reported on 11/22/2023), Disp: , Rfl:    fluticasone  (FLONASE ) 50 MCG/ACT nasal spray, Place 1 spray into both nostrils daily. (Patient not taking: Reported on 11/22/2023), Disp: 16 g, Rfl: 2

## 2023-11-22 NOTE — Patient Instructions (Addendum)
 Start symbicort  80-4.62mcg 2 puffs twice daily - rinse mouth out after each use  Use albuterol  inhaler 1-2 puffs or albuterol  nebulizer solution every 4-6 hours as needed  Follow up in 1 year, call sooner if needed
